# Patient Record
Sex: Female | Born: 1950 | Race: White | Hispanic: No | Marital: Married | Smoking: Never smoker
Health system: Southern US, Community
[De-identification: ages and names within clinical notes are randomized; demographics above are authoritative.]

## PROBLEM LIST (undated history)

## (undated) DIAGNOSIS — G8929 Other chronic pain: Secondary | ICD-10-CM

## (undated) DIAGNOSIS — M549 Dorsalgia, unspecified: Secondary | ICD-10-CM

## (undated) DIAGNOSIS — E876 Hypokalemia: Secondary | ICD-10-CM

## (undated) DIAGNOSIS — R790 Abnormal level of blood mineral: Secondary | ICD-10-CM

## (undated) DIAGNOSIS — R000 Tachycardia, unspecified: Secondary | ICD-10-CM

## (undated) HISTORY — PX: ROTATOR CUFF REPAIR: SHX139

## (undated) HISTORY — PX: CHOLECYSTECTOMY: SHX55

---

## 1997-10-01 ENCOUNTER — Ambulatory Visit (HOSPITAL_COMMUNITY): Admission: RE | Admit: 1997-10-01 | Discharge: 1997-10-01 | Payer: Self-pay | Admitting: Obstetrics & Gynecology

## 1998-03-04 ENCOUNTER — Other Ambulatory Visit: Admission: RE | Admit: 1998-03-04 | Discharge: 1998-03-04 | Payer: Self-pay | Admitting: Obstetrics & Gynecology

## 1998-08-30 ENCOUNTER — Other Ambulatory Visit: Admission: RE | Admit: 1998-08-30 | Discharge: 1998-08-30 | Payer: Self-pay | Admitting: Obstetrics & Gynecology

## 1999-06-14 ENCOUNTER — Other Ambulatory Visit: Admission: RE | Admit: 1999-06-14 | Discharge: 1999-06-14 | Payer: Self-pay | Admitting: Obstetrics & Gynecology

## 2000-07-26 ENCOUNTER — Encounter: Admission: RE | Admit: 2000-07-26 | Discharge: 2000-07-26 | Payer: Self-pay | Admitting: Obstetrics & Gynecology

## 2000-07-26 ENCOUNTER — Encounter: Payer: Self-pay | Admitting: Obstetrics & Gynecology

## 2000-07-26 ENCOUNTER — Other Ambulatory Visit: Admission: RE | Admit: 2000-07-26 | Discharge: 2000-07-26 | Payer: Self-pay | Admitting: Obstetrics & Gynecology

## 2002-07-03 ENCOUNTER — Ambulatory Visit (HOSPITAL_COMMUNITY): Admission: RE | Admit: 2002-07-03 | Discharge: 2002-07-03 | Payer: Self-pay | Admitting: Orthopedic Surgery

## 2002-07-16 ENCOUNTER — Ambulatory Visit (HOSPITAL_BASED_OUTPATIENT_CLINIC_OR_DEPARTMENT_OTHER): Admission: RE | Admit: 2002-07-16 | Discharge: 2002-07-16 | Payer: Self-pay | Admitting: Orthopedic Surgery

## 2003-01-30 ENCOUNTER — Ambulatory Visit (HOSPITAL_BASED_OUTPATIENT_CLINIC_OR_DEPARTMENT_OTHER): Admission: RE | Admit: 2003-01-30 | Discharge: 2003-01-30 | Payer: Self-pay | Admitting: Orthopedic Surgery

## 2004-07-08 ENCOUNTER — Other Ambulatory Visit: Admission: RE | Admit: 2004-07-08 | Discharge: 2004-07-08 | Payer: Self-pay | Admitting: Obstetrics & Gynecology

## 2004-10-13 ENCOUNTER — Other Ambulatory Visit: Admission: RE | Admit: 2004-10-13 | Discharge: 2004-10-13 | Payer: Self-pay | Admitting: Obstetrics & Gynecology

## 2004-12-09 ENCOUNTER — Other Ambulatory Visit: Admission: RE | Admit: 2004-12-09 | Discharge: 2004-12-09 | Payer: Self-pay | Admitting: Obstetrics and Gynecology

## 2006-01-31 ENCOUNTER — Emergency Department: Payer: Self-pay | Admitting: Emergency Medicine

## 2008-07-27 ENCOUNTER — Emergency Department (HOSPITAL_BASED_OUTPATIENT_CLINIC_OR_DEPARTMENT_OTHER): Admission: EM | Admit: 2008-07-27 | Discharge: 2008-07-27 | Payer: Self-pay | Admitting: Emergency Medicine

## 2008-07-27 ENCOUNTER — Ambulatory Visit: Payer: Self-pay | Admitting: Diagnostic Radiology

## 2009-03-08 ENCOUNTER — Ambulatory Visit: Payer: Self-pay | Admitting: Diagnostic Radiology

## 2009-03-08 ENCOUNTER — Encounter (HOSPITAL_COMMUNITY): Payer: Self-pay | Admitting: Emergency Medicine

## 2009-03-08 ENCOUNTER — Inpatient Hospital Stay (HOSPITAL_COMMUNITY): Admission: RE | Admit: 2009-03-08 | Discharge: 2009-03-10 | Payer: Self-pay | Admitting: Internal Medicine

## 2009-03-10 ENCOUNTER — Emergency Department (HOSPITAL_COMMUNITY): Admission: EM | Admit: 2009-03-10 | Discharge: 2009-03-11 | Payer: Self-pay | Admitting: Dermatopathology

## 2010-03-09 ENCOUNTER — Emergency Department (HOSPITAL_COMMUNITY): Admission: EM | Admit: 2010-03-09 | Discharge: 2010-03-09 | Payer: Self-pay | Admitting: Emergency Medicine

## 2010-03-10 ENCOUNTER — Inpatient Hospital Stay (HOSPITAL_COMMUNITY): Admission: EM | Admit: 2010-03-10 | Discharge: 2010-03-14 | Payer: Self-pay | Admitting: Internal Medicine

## 2010-03-12 ENCOUNTER — Encounter (INDEPENDENT_AMBULATORY_CARE_PROVIDER_SITE_OTHER): Payer: Self-pay | Admitting: Internal Medicine

## 2010-05-06 ENCOUNTER — Emergency Department (HOSPITAL_BASED_OUTPATIENT_CLINIC_OR_DEPARTMENT_OTHER)
Admission: EM | Admit: 2010-05-06 | Discharge: 2010-05-06 | Payer: Self-pay | Source: Home / Self Care | Admitting: Emergency Medicine

## 2010-08-09 LAB — CBC
HCT: 43.7 % (ref 36.0–46.0)
Hemoglobin: 15.2 g/dL — ABNORMAL HIGH (ref 12.0–15.0)
MCH: 30.8 pg (ref 26.0–34.0)
MCV: 88.5 fL (ref 78.0–100.0)
Platelets: 441 10*3/uL — ABNORMAL HIGH (ref 150–400)
RBC: 4.94 MIL/uL (ref 3.87–5.11)

## 2010-08-09 LAB — COMPREHENSIVE METABOLIC PANEL
Alkaline Phosphatase: 123 U/L — ABNORMAL HIGH (ref 39–117)
BUN: 12 mg/dL (ref 6–23)
CO2: 18 mEq/L — ABNORMAL LOW (ref 19–32)
Chloride: 101 mEq/L (ref 96–112)
Creatinine, Ser: 0.6 mg/dL (ref 0.4–1.2)
GFR calc non Af Amer: >60 mL/min (ref >60)
Glucose, Bld: 142 mg/dL — ABNORMAL HIGH (ref 70–99)
Potassium: 3.9 mEq/L (ref 3.5–5.1)
Total Bilirubin: 0.9 mg/dL (ref 0.3–1.2)

## 2010-08-09 LAB — DIFFERENTIAL
Basophils Absolute: 0 10*3/uL (ref 0.0–0.1)
Basophils Relative: 0 % (ref 0–1)
Lymphocytes Relative: 13 % (ref 12–46)
Neutro Abs: 13.6 10*3/uL — ABNORMAL HIGH (ref 1.7–7.7)
Neutrophils Relative %: 77 % (ref 43–77)

## 2010-08-09 LAB — URINALYSIS, ROUTINE W REFLEX MICROSCOPIC
Hgb urine dipstick: NEGATIVE
Nitrite: NEGATIVE
Protein, ur: 100 mg/dL — AB
Specific Gravity, Urine: 1.016 (ref 1.005–1.030)
Urobilinogen, UA: 0.2 mg/dL (ref 0.0–1.0)

## 2010-08-09 LAB — LIPASE, BLOOD: Lipase: 32 U/L (ref 23–300)

## 2010-08-09 LAB — URINE MICROSCOPIC-ADD ON

## 2010-08-10 LAB — CARDIAC PANEL(CRET KIN+CKTOT+MB+TROPI)
CK, MB: 1.6 ng/mL (ref 0.3–4.0)
Relative Index: 0.5 (ref 0.0–2.5)
Relative Index: 0.6 (ref 0.0–2.5)
Troponin I: 0.02 ng/mL (ref 0.00–0.06)
Troponin I: 0.02 ng/mL (ref 0.00–0.06)

## 2010-08-10 LAB — BASIC METABOLIC PANEL
BUN: 6 mg/dL (ref 6–23)
BUN: 6 mg/dL (ref 6–23)
BUN: 8 mg/dL (ref 6–23)
CO2: 18 mEq/L — ABNORMAL LOW (ref 19–32)
CO2: 23 mEq/L (ref 19–32)
Calcium: 8.7 mg/dL (ref 8.4–10.5)
Calcium: 8.7 mg/dL (ref 8.4–10.5)
Calcium: 8.9 mg/dL (ref 8.4–10.5)
Creatinine, Ser: 0.47 mg/dL (ref 0.4–1.2)
Creatinine, Ser: 0.58 mg/dL (ref 0.4–1.2)
GFR calc Af Amer: >60 mL/min (ref >60)
GFR calc non Af Amer: >60 mL/min (ref >60)
GFR calc non Af Amer: >60 mL/min (ref >60)
GFR calc non Af Amer: >60 mL/min (ref >60)
Glucose, Bld: 112 mg/dL — ABNORMAL HIGH (ref 70–99)
Glucose, Bld: 114 mg/dL — ABNORMAL HIGH (ref 70–99)
Glucose, Bld: 117 mg/dL — ABNORMAL HIGH (ref 70–99)
Glucose, Bld: 96 mg/dL (ref 70–99)
Potassium: 2.8 mEq/L — ABNORMAL LOW (ref 3.5–5.1)
Potassium: 3.5 mEq/L (ref 3.5–5.1)
Sodium: 129 mEq/L — ABNORMAL LOW (ref 135–145)
Sodium: 131 mEq/L — ABNORMAL LOW (ref 135–145)
Sodium: 131 mEq/L — ABNORMAL LOW (ref 135–145)

## 2010-08-10 LAB — COMPREHENSIVE METABOLIC PANEL
ALT: 145 U/L — ABNORMAL HIGH (ref 0–35)
AST: 89 U/L — ABNORMAL HIGH (ref 0–37)
Albumin: 3.5 g/dL (ref 3.5–5.2)
Albumin: 3.9 g/dL (ref 3.5–5.2)
Alkaline Phosphatase: 80 U/L (ref 39–117)
Alkaline Phosphatase: 96 U/L (ref 39–117)
BUN: 7 mg/dL (ref 6–23)
Chloride: 100 mEq/L (ref 96–112)
Chloride: 104 mEq/L (ref 96–112)
Creatinine, Ser: 0.61 mg/dL (ref 0.4–1.2)
GFR calc Af Amer: >60 mL/min (ref >60)
Glucose, Bld: 81 mg/dL (ref 70–99)
Potassium: 2.7 mEq/L — CL (ref 3.5–5.1)
Potassium: 2.7 mEq/L — CL (ref 3.5–5.1)
Sodium: 129 mEq/L — ABNORMAL LOW (ref 135–145)
Total Bilirubin: 1.1 mg/dL (ref 0.3–1.2)
Total Bilirubin: 1.1 mg/dL (ref 0.3–1.2)
Total Protein: 6.9 g/dL (ref 6.0–8.3)

## 2010-08-11 LAB — HEPATIC FUNCTION PANEL
ALT: 25 U/L (ref 0–35)
AST: 26 U/L (ref 0–37)
Bilirubin, Direct: 0.1 mg/dL (ref 0.0–0.3)
Total Bilirubin: 0.6 mg/dL (ref 0.3–1.2)

## 2010-08-11 LAB — URINALYSIS, ROUTINE W REFLEX MICROSCOPIC
Glucose, UA: NEGATIVE mg/dL
Ketones, ur: NEGATIVE mg/dL
Protein, ur: NEGATIVE mg/dL

## 2010-08-11 LAB — CBC
HCT: 42.2 % (ref 36.0–46.0)
HCT: 34 % — ABNORMAL LOW (ref 36.0–46.0)
HCT: 37.8 % (ref 36.0–46.0)
Hemoglobin: 11.4 g/dL — ABNORMAL LOW (ref 12.0–15.0)
MCH: 32.3 pg (ref 26.0–34.0)
MCHC: 34.2 g/dL (ref 30.0–36.0)
MCHC: 33.5 g/dL (ref 30.0–36.0)
MCHC: 34.9 g/dL (ref 30.0–36.0)
MCV: 94.5 fL (ref 78.0–100.0)
MCV: 93.9 fL (ref 78.0–100.0)
RDW: 13.4 % (ref 11.5–15.5)
RDW: 13.2 % (ref 11.5–15.5)

## 2010-08-11 LAB — BASIC METABOLIC PANEL
BUN: 9 mg/dL (ref 6–23)
Calcium: 8.9 mg/dL (ref 8.4–10.5)
Chloride: 104 mEq/L (ref 96–112)
Creatinine, Ser: 0.55 mg/dL (ref 0.4–1.2)
GFR calc Af Amer: >60 mL/min (ref >60)
GFR calc non Af Amer: >60 mL/min (ref >60)
Glucose, Bld: 127 mg/dL — ABNORMAL HIGH (ref 70–99)
Potassium: 3.5 mEq/L (ref 3.5–5.1)
Sodium: 136 mEq/L (ref 135–145)

## 2010-08-11 LAB — DIFFERENTIAL
Basophils Absolute: 0 10*3/uL (ref 0.0–0.1)
Basophils Relative: 0 % (ref 0–1)
Eosinophils Absolute: 0 10*3/uL (ref 0.0–0.7)
Eosinophils Relative: 0 % (ref 0–5)
Monocytes Absolute: 0.5 10*3/uL (ref 0.1–1.0)

## 2010-08-11 LAB — COMPREHENSIVE METABOLIC PANEL
ALT: 84 U/L — ABNORMAL HIGH (ref 0–35)
AST: 73 U/L — ABNORMAL HIGH (ref 0–37)
Albumin: 3.2 g/dL — ABNORMAL LOW (ref 3.5–5.2)
Calcium: 8.5 mg/dL (ref 8.4–10.5)
Creatinine, Ser: 0.68 mg/dL (ref 0.4–1.2)
GFR calc Af Amer: >60 mL/min (ref >60)
GFR calc non Af Amer: >60 mL/min (ref >60)
Sodium: 140 mEq/L (ref 135–145)
Total Protein: 5.7 g/dL — ABNORMAL LOW (ref 6.0–8.3)

## 2010-08-11 LAB — AMYLASE: Amylase: 52 U/L (ref 0–105)

## 2010-08-11 LAB — CK TOTAL AND CKMB (NOT AT ARMC): CK, MB: 2.9 ng/mL (ref 0.3–4.0)

## 2010-08-11 LAB — MAGNESIUM: Magnesium: 1.9 mg/dL (ref 1.5–2.5)

## 2010-09-01 LAB — COMPREHENSIVE METABOLIC PANEL
ALT: 29 U/L (ref 0–35)
AST: 31 U/L (ref 0–37)
Albumin: 3.6 g/dL (ref 3.5–5.2)
Alkaline Phosphatase: 74 U/L (ref 39–117)
CO2: 25 mEq/L (ref 19–32)
Calcium: 10.7 mg/dL — ABNORMAL HIGH (ref 8.4–10.5)
Creatinine, Ser: 0.6 mg/dL (ref 0.4–1.2)
GFR calc Af Amer: >60 mL/min (ref >60)
GFR calc Af Amer: >60 mL/min (ref >60)
GFR calc non Af Amer: >60 mL/min (ref >60)
Glucose, Bld: 127 mg/dL — ABNORMAL HIGH (ref 70–99)
Potassium: 3.6 mEq/L (ref 3.5–5.1)
Sodium: 136 mEq/L (ref 135–145)
Total Protein: 8.6 g/dL — ABNORMAL HIGH (ref 6.0–8.3)
Total Protein: 7 g/dL (ref 6.0–8.3)

## 2010-09-01 LAB — BASIC METABOLIC PANEL
BUN: 8 mg/dL (ref 6–23)
Chloride: 105 mEq/L (ref 96–112)
GFR calc Af Amer: >60 mL/min (ref >60)
Potassium: 3.2 mEq/L — ABNORMAL LOW (ref 3.5–5.1)
Sodium: 134 mEq/L — ABNORMAL LOW (ref 135–145)

## 2010-09-01 LAB — CBC
HCT: 33 % — ABNORMAL LOW (ref 36.0–46.0)
Hemoglobin: 11.1 g/dL — ABNORMAL LOW (ref 12.0–15.0)
MCHC: 33.8 g/dL (ref 30.0–36.0)
MCV: 95.1 fL (ref 78.0–100.0)
MCV: 95.9 fL (ref 78.0–100.0)
Platelets: 405 10*3/uL — ABNORMAL HIGH (ref 150–400)
RBC: 4.35 MIL/uL (ref 3.87–5.11)
RBC: 3.44 MIL/uL — ABNORMAL LOW (ref 3.87–5.11)
RDW: 12.3 % (ref 11.5–15.5)
RDW: 12.9 % (ref 11.5–15.5)
WBC: 8.2 10*3/uL (ref 4.0–10.5)

## 2010-09-01 LAB — DIFFERENTIAL
Basophils Relative: 0 % (ref 0–1)
Eosinophils Absolute: 0 10*3/uL (ref 0.0–0.7)
Eosinophils Absolute: 0.1 10*3/uL (ref 0.0–0.7)
Eosinophils Relative: 1 % (ref 0–5)
Lymphocytes Relative: 16 % (ref 12–46)
Lymphs Abs: 2.2 10*3/uL (ref 0.7–4.0)
Lymphs Abs: 3.2 10*3/uL (ref 0.7–4.0)
Monocytes Absolute: 0.7 10*3/uL (ref 0.1–1.0)
Monocytes Absolute: 0.8 10*3/uL (ref 0.1–1.0)
Monocytes Relative: 10 % (ref 3–12)
Monocytes Relative: 5 % (ref 3–12)
Neutro Abs: 11.5 10*3/uL — ABNORMAL HIGH (ref 1.7–7.7)
Neutrophils Relative %: 79 % — ABNORMAL HIGH (ref 43–77)

## 2010-09-01 LAB — POCT I-STAT, CHEM 8
Creatinine, Ser: 0.4 mg/dL (ref 0.4–1.2)
Glucose, Bld: 83 mg/dL (ref 70–99)
Hemoglobin: 13.3 g/dL (ref 12.0–15.0)
TCO2: 21 mmol/L (ref 0–100)

## 2010-09-01 LAB — CARDIAC PANEL(CRET KIN+CKTOT+MB+TROPI)
CK, MB: 2.7 ng/mL (ref 0.3–4.0)
Relative Index: 1.6 (ref 0.0–2.5)
Relative Index: 1.8 (ref 0.0–2.5)
Relative Index: 2 (ref 0.0–2.5)
Troponin I: 0.01 ng/mL (ref 0.00–0.06)
Troponin I: 0.01 ng/mL (ref 0.00–0.06)

## 2010-09-01 LAB — LIPASE, BLOOD: Lipase: 70 U/L (ref 23–300)

## 2010-09-01 LAB — LIPID PANEL
Triglycerides: 93 mg/dL (ref <150)
VLDL: 19 mg/dL (ref 0–40)

## 2010-09-08 LAB — URINALYSIS, ROUTINE W REFLEX MICROSCOPIC
Glucose, UA: NEGATIVE mg/dL
Ketones, ur: NEGATIVE mg/dL
Protein, ur: NEGATIVE mg/dL

## 2010-09-08 LAB — COMPREHENSIVE METABOLIC PANEL
ALT: 34 U/L (ref 0–35)
Alkaline Phosphatase: 78 U/L (ref 39–117)
CO2: 26 mEq/L (ref 19–32)
Chloride: 96 mEq/L (ref 96–112)
GFR calc non Af Amer: >60 mL/min (ref >60)
Glucose, Bld: 99 mg/dL (ref 70–99)
Potassium: 5.3 mEq/L — ABNORMAL HIGH (ref 3.5–5.1)
Sodium: 130 mEq/L — ABNORMAL LOW (ref 135–145)
Total Bilirubin: 0.6 mg/dL (ref 0.3–1.2)
Total Protein: 7.4 g/dL (ref 6.0–8.3)

## 2010-09-08 LAB — DIFFERENTIAL
Basophils Relative: 1 % (ref 0–1)
Eosinophils Absolute: 0.3 10*3/uL (ref 0.0–0.7)
Monocytes Relative: 9 % (ref 3–12)
Neutrophils Relative %: 63 % (ref 43–77)

## 2010-09-08 LAB — CBC
MCHC: 34.4 g/dL (ref 30.0–36.0)
MCV: 95.6 fL (ref 78.0–100.0)
RDW: 12.7 % (ref 11.5–15.5)

## 2010-09-08 LAB — URINE CULTURE: Culture: NO GROWTH

## 2010-10-14 NOTE — Op Note (Signed)
NAME:  Katie Lloyd, Katie Lloyd                            ACCOUNT NO.:  [id number]   MEDICAL RECORD NO.:  [id number]                   PATIENT TYPE:  OUT   LOCATION:  VASC                                 FACILITY:  [facility]   PHYSICIAN:  Thera Flake., M.D.             DATE OF BIRTH:  [birth year]   DATE OF PROCEDURE:  07/16/2002  DATE OF DISCHARGE:  07/03/2002                                 OPERATIVE REPORT   PREOPERATIVE DIAGNOSES:  1. Torn medial and lateral menisci, right knee.  2. Grade 3 chondromalacia, medial compartment; grade 2 and 3 chondromalacia,     lateral compartment, grade 3 and 4 chondromalacia, patellofemoral joint     with lateral tracking of the patella.   POSTOPERATIVE DIAGNOSES:  1. Torn medial and lateral menisci, right knee.  2. Grade 3 chondromalacia, medial compartment; grade 2 and 3 chondromalacia,     lateral compartment, grade 3 and 4 chondromalacia, patellofemoral joint     with lateral tracking of the patella.   OPERATION PERFORMED:  1. Partial medial and lateral meniscectomy.  2. Debridement chondroplasty.  3. Arthroscopic lateral release.   SURGEON:  Dyke Brackett, M.D.   ANESTHESIA:  MAC.   INDICATIONS FOR PROCEDURE:  The patient is a 60 year old with MRI proven  right knee medial meniscal tearing felt to be amenable to outpatient  arthroscopy.  She also has lateral patellar retracting and degenerative  change in the lateral facet.  She was advised that this may be of benefit to  do a lateral release.   DESCRIPTION OF PROCEDURE:  The patient was arthroscoped through a  superomedial, inferomedial and posterolateral portal.  Systematic inspection  of the knee showed the patient to have severe chondromalacia patella, grade  3 mostly on the lateral side with actually grade 4 changes on the lateral  trochlear groove and lateral femoral condyle.  The medial compartment on the  weightbearing surface of the medial femoral condyle showed advanced grade 3  but no grade 4 changes, less severe grade 2 changes and early grade 3  changes on the tibia.  The posterior horn tear of the medial meniscus  requiring resection of about 30% of the meniscus substance.  ACL, PCL were  normal.  Lateral meniscus showed a small radial tear requiring 5 to 10%  resection of the meniscus with less severe early grade 3 changes along the  femoral condyle and grade 2 changes along the tibial plateau.  Tricompartmental debridement was carried out.  Arthroscopic lateral release  carried out on the lateral side with the ArthroCare wand.  The knee was  drained free of fluid.  Portals of the joint were infiltrated with Marcaine  and morphine with addition of 40 mg Depo-Medrol into the joint.  Lightly  compressive sterile dressing was applied.  Thera Flake., M.D.   WDC/MEDQ  D:  07/16/2002  T:  07/16/2002  Job:  [id number]

## 2010-10-14 NOTE — Op Note (Signed)
   NAME:  Katie Lloyd, Katie Lloyd                            ACCOUNT NO.:  [id number]   MEDICAL RECORD NO.:  [id number]                   PATIENT TYPE:  AMB   LOCATION:  DSC                                  FACILITY:  [facility]   PHYSICIAN:  Thera Flake., M.D.             DATE OF BIRTH:  02/19/1951   DATE OF PROCEDURE:  DATE OF DISCHARGE:                                 OPERATIVE REPORT   PREOPERATIVE DIAGNOSIS:  Grade 3 chondromalacia of the medial, lateral, and  patellofemoral joints (advanced).   POSTOPERATIVE DIAGNOSIS:  Grade 3 chondromalacia of the medial, lateral, and  patellofemoral joints (advanced).   PROCEDURE:  1. Debridement chondroplasty (3 tricompartmental).  2. Excision thick fibrotic shelf anterior medial knee.   SURGEON:  Dyke Brackett, M.D.   ANESTHESIA:  MAC   DESCRIPTION OF PROCEDURE:  Arthroscope through an inferomedial and  inferolateral portal.  Systematic inspection of the knee showed the patient  to have advanced grade 3 changes of the patellofemoral joint; no grade 4  changes, likewise, particularly on the femoral side.  On the  medial side  there were advanced grade 3 changes; less severe, but still grade 3 changes  on the tibia.  These were redebrided.  These looked reasonably good. There  was a punctate lesion of grade 3 changes probably over a 2 x 2 cm in the  lateral condyle.  This seemed to be more advanced than the last procedure.  Certainly it was of some concern given the patient's pain that a lot of the  pain in general would be construed to be due to arthritis.  Aggressive  debridement of all 3 compartments was carried out.   In terms of preoperative exam showing a very thick fibrotic plica, and very  painful, this was confirmed.  It was repetitively examined in the office.  Aggressive debridement of the plica was carried out relieving the scar-type  tissue along the anterior medial condyle.  The knee was drained, free of  fluid.  Portals were  closed and knee infiltrated with Marcaine and morphine  in addition to 4 mg of Depo-Medrol.  Taken to the recovery room in stable  condition.                                               Thera Flake., M.D.    WDC/MEDQ  D:  01/30/2003  T:  01/30/2003  Job:  [id number]

## 2011-03-17 ENCOUNTER — Emergency Department (HOSPITAL_BASED_OUTPATIENT_CLINIC_OR_DEPARTMENT_OTHER)
Admission: EM | Admit: 2011-03-17 | Discharge: 2011-03-17 | Disposition: A | Discharge status: Other Acute Inpatient Hospital | Payer: PRIVATE HEALTH INSURANCE | Attending: Emergency Medicine | Admitting: Emergency Medicine

## 2011-03-17 ENCOUNTER — Other Ambulatory Visit: Payer: Self-pay

## 2011-03-17 ENCOUNTER — Emergency Department (INDEPENDENT_AMBULATORY_CARE_PROVIDER_SITE_OTHER): Payer: PRIVATE HEALTH INSURANCE

## 2011-03-17 ENCOUNTER — Encounter: Payer: Self-pay | Admitting: *Deleted

## 2011-03-17 DIAGNOSIS — J45909 Unspecified asthma, uncomplicated: Secondary | ICD-10-CM | POA: Insufficient documentation

## 2011-03-17 DIAGNOSIS — R1115 Cyclical vomiting syndrome unrelated to migraine: Secondary | ICD-10-CM | POA: Insufficient documentation

## 2011-03-17 DIAGNOSIS — G8929 Other chronic pain: Secondary | ICD-10-CM | POA: Insufficient documentation

## 2011-03-17 DIAGNOSIS — R112 Nausea with vomiting, unspecified: Secondary | ICD-10-CM

## 2011-03-17 DIAGNOSIS — R0789 Other chest pain: Secondary | ICD-10-CM

## 2011-03-17 DIAGNOSIS — R1110 Vomiting, unspecified: Secondary | ICD-10-CM

## 2011-03-17 DIAGNOSIS — R109 Unspecified abdominal pain: Secondary | ICD-10-CM

## 2011-03-17 HISTORY — DX: Dorsalgia, unspecified: M54.9

## 2011-03-17 HISTORY — DX: Other chronic pain: G89.29

## 2011-03-17 HISTORY — DX: Tachycardia, unspecified: R00.0

## 2011-03-17 HISTORY — DX: Hypokalemia: E87.6

## 2011-03-17 HISTORY — DX: Abnormal level of blood mineral: R79.0

## 2011-03-17 LAB — CBC
HCT: 39.1 % (ref 36.0–46.0)
Hemoglobin: 13.9 g/dL (ref 12.0–15.0)
MCH: 31.4 pg (ref 26.0–34.0)
MCHC: 35.5 g/dL (ref 30.0–36.0)
MCV: 88.3 fL (ref 78.0–100.0)
Platelets: 396 K/uL (ref 150–400)
RBC: 4.43 MIL/uL (ref 3.87–5.11)
RDW: 12.3 % (ref 11.5–15.5)
WBC: 15.4 K/uL — ABNORMAL HIGH (ref 4.0–10.5)

## 2011-03-17 LAB — URINALYSIS, ROUTINE W REFLEX MICROSCOPIC
Bilirubin Urine: NEGATIVE
Glucose, UA: NEGATIVE mg/dL
Hgb urine dipstick: NEGATIVE
Ketones, ur: NEGATIVE mg/dL
Leukocytes, UA: NEGATIVE
Nitrite: NEGATIVE
Protein, ur: NEGATIVE mg/dL
Specific Gravity, Urine: 1.019 (ref 1.005–1.030)
Urobilinogen, UA: 0.2 mg/dL (ref 0.0–1.0)
pH: 7 (ref 5.0–8.0)

## 2011-03-17 LAB — COMPREHENSIVE METABOLIC PANEL WITH GFR
ALT: 24 U/L (ref 0–35)
AST: 18 U/L (ref 0–37)
Albumin: 4.3 g/dL (ref 3.5–5.2)
Alkaline Phosphatase: 112 U/L (ref 39–117)
BUN: 9 mg/dL (ref 6–23)
CO2: 23 meq/L (ref 19–32)
Calcium: 9.7 mg/dL (ref 8.4–10.5)
Chloride: 93 meq/L — ABNORMAL LOW (ref 96–112)
Creatinine, Ser: 0.5 mg/dL (ref 0.50–1.10)
GFR calc Af Amer: >90 mL/min
GFR calc non Af Amer: >90 mL/min
Glucose, Bld: 140 mg/dL — ABNORMAL HIGH (ref 70–99)
Potassium: 3.5 meq/L (ref 3.5–5.1)
Sodium: 130 meq/L — ABNORMAL LOW (ref 135–145)
Total Bilirubin: 0.4 mg/dL (ref 0.3–1.2)
Total Protein: 8.1 g/dL (ref 6.0–8.3)

## 2011-03-17 LAB — DIFFERENTIAL
Lymphocytes Relative: 14 % (ref 12–46)
Lymphs Abs: 2.1 10*3/uL (ref 0.7–4.0)
Monocytes Relative: 5 % (ref 3–12)
Neutro Abs: 12.6 10*3/uL — ABNORMAL HIGH (ref 1.7–7.7)
Neutrophils Relative %: 82 % — ABNORMAL HIGH (ref 43–77)

## 2011-03-17 LAB — MAGNESIUM: Magnesium: 1.6 mg/dL (ref 1.5–2.5)

## 2011-03-17 LAB — LIPASE, BLOOD: Lipase: 16 U/L (ref 11–59)

## 2011-03-17 MED ORDER — FENTANYL CITRATE 0.05 MG/ML IJ SOLN
50.0000 ug | Freq: Once | INTRAMUSCULAR | Status: AC
  Administered 2011-03-17: 100 ug via INTRAVENOUS
  Filled 2011-03-17: qty 2

## 2011-03-17 MED ORDER — LORAZEPAM 2 MG/ML PO CONC
1.0000 mg | Freq: Once | ORAL | Status: AC
  Administered 2011-03-17: 1 mg via ORAL
  Filled 2011-03-17: qty 0.5

## 2011-03-17 MED ORDER — LORAZEPAM 2 MG/ML IJ SOLN
INTRAMUSCULAR | Status: AC
  Filled 2011-03-17: qty 1

## 2011-03-17 MED ORDER — FENTANYL CITRATE 0.05 MG/ML IJ SOLN
100.0000 ug | Freq: Once | INTRAMUSCULAR | Status: AC
  Administered 2011-03-17: 100 ug via INTRAVENOUS
  Filled 2011-03-17: qty 2

## 2011-03-17 MED ORDER — PANTOPRAZOLE SODIUM 40 MG IV SOLR
40.0000 mg | Freq: Once | INTRAVENOUS | Status: AC
  Administered 2011-03-17: 40 mg via INTRAVENOUS
  Filled 2011-03-17: qty 40

## 2011-03-17 MED ORDER — ONDANSETRON HCL 4 MG/2ML IJ SOLN
4.0000 mg | Freq: Once | INTRAMUSCULAR | Status: AC
  Administered 2011-03-17: 4 mg via INTRAVENOUS
  Filled 2011-03-17: qty 2

## 2011-03-17 MED ORDER — METOCLOPRAMIDE HCL 5 MG/ML IJ SOLN
10.0000 mg | Freq: Once | INTRAMUSCULAR | Status: AC
  Administered 2011-03-17: 10 mg via INTRAVENOUS
  Filled 2011-03-17: qty 2

## 2011-03-17 MED ORDER — DIPHENHYDRAMINE HCL 50 MG/ML IJ SOLN
25.0000 mg | Freq: Once | INTRAMUSCULAR | Status: AC
  Administered 2011-03-17: 50 mg via INTRAVENOUS
  Filled 2011-03-17: qty 1

## 2011-03-17 MED ORDER — SODIUM CHLORIDE 0.9 % IV BOLUS (SEPSIS)
2000.0000 mL | Freq: Once | INTRAVENOUS | Status: AC
  Administered 2011-03-17: 1000 mL via INTRAVENOUS

## 2011-03-17 MED ORDER — FENTANYL CITRATE 0.05 MG/ML IJ SOLN
75.0000 ug | Freq: Once | INTRAMUSCULAR | Status: AC
  Administered 2011-03-17: 75 ug via INTRAVENOUS
  Filled 2011-03-17: qty 2

## 2011-03-17 NOTE — ED Notes (Signed)
Report given to Teri RN

## 2011-03-17 NOTE — ED Notes (Signed)
Pt reports sudden onset of vomiting around 6pm last night, followed by abdominal pains and diarrhea. Denies fevers. Denies urinary symptoms.

## 2011-03-17 NOTE — ED Notes (Signed)
Called dr. Enis Slipper office for a pager to call [phone]. Given a number to call the hospital in [place] at [phone] for page for dr. Ignacia Palma to speak with dr. Enis Slipper.

## 2011-03-17 NOTE — ED Notes (Signed)
Per EMS: pt c/o abdominal pains with N/V/D since 8pm last night. Unable to hold down food/fluids. Pt reports "this is an annual occurrence". Pt is pale upon arrival to ER. Pt is dry heaving. IV started by EMS with bolus. Pt took 75mg  of phenergan over the last 6 hours without relief.

## 2011-03-17 NOTE — ED Notes (Signed)
[facility] is to transfer [facility] to [facility] to room 308.  Receiving doctor is brent seifert. The receiving nurse will be wanda bradshaw.

## 2011-03-17 NOTE — ED Notes (Signed)
[facility]ia [facility]--spoke with Rhonda--gave her information on patient.  Will call back with room and doctor's first name when dr. Enis Slipper or hospital call back with information.

## 2011-03-17 NOTE — ED Provider Notes (Signed)
  Physical Exam  BP 157/84  Pulse 87  Temp(Src) 98.2 F (36.8 C) (Oral)  Resp 18  Ht 5\' 9"  (1.753 m)  Wt 220 lb (99.791 kg)  BMI 32.49 kg/m2  SpO2 96%  Physical Exam  ED Course  Procedures   7:18 AM Care assumed from Dr. Read Drivers.  Pt is a 60 year old woman who has had abdominal pain, vomiting and diarrhea since yesterday.  She has this every October, as confirmed by review of her records. She says she has vomited once since arriving here today. She is receiving IV fluids and IV anti-emetics. On exam now, she has mild upper abdominal tenderness. Lab workup to this point shows elevated WBC of about 15,000, chemistries with Na+ of 130, all other labs relatively normal.  Awaiting acute abdominal series, will continue to observe.  8:23 AM Pt's abdominal series shows moderate stool burden, otherwise negative.  She has vomited again, and continues to have abdominal pain.  Will remedicate.  If she does not improve, will need hospitalization.  9:39 AM She continues with abdominal pain, nausea.  Will call her physician, Dr. Enis Slipper in [place], [place].    9:49 AM Case discussed with Mia Creek, M.D.  He will arrange transfer of pt to [facility].    10:58 AM Pt has been accepted in transfer to [facility] by Mia Creek, M.D.  [facility] will transport her there.  Diagnosis:  Intractable vomiting.  Carleene Cooper III, MD 03/17/11 [phone]

## 2011-03-17 NOTE — ED Provider Notes (Signed)
History     CSN: [id number] Arrival date & time: 03/17/2011  5:59 AM   First MD Initiated Contact with Patient 03/17/11 [phone]      Chief Complaint  Patient presents with  . GI Problem    (Consider location/radiation/quality/duration/timing/severity/associated sxs/prior treatment) HPI This is a 60 year old white female who had the sudden onset of nausea vomiting and diarrhea yesterday afternoon about 3 PM. She is continued to vomit and have diarrhea since. She tried taking rectal Phenergan at home on 3 occasions with some relief of vomiting but no relief of the nausea. She describes the symptoms as severe. She has had upper abdominal pain associated with this. The pain came on after she began vomiting. She feels weak and has a dry mouth. She denies blood in her stool. She states she has a similar illness every October about this time. She was given a 500 mL bolus of normal saline and 4 mg of Zofran IV prior to arrival by EMS. There was little improvement in her nausea and she was noted to be retching on arrival by nursing staff; staff also noted ileus emesis on the patient's clothing. She states she has been hospitalized in the past for this due to the low potassium, low magnesium and the need for Zofran every 2 hours.  Past Medical History  Diagnosis Date  . Asthma   . Hypokalemia   . Low magnesium levels   . Tachycardia   . Hypotension   . Chronic back pain     Past Surgical History  Procedure Date  . Cholecystectomy   . Rotator cuff repair     No family history on file.  History  Substance Use Topics  . Smoking status: Never Smoker   . Smokeless tobacco: Not on file  . Alcohol Use: No    OB History    Grav Para Term Preterm Abortions TAB SAB Ect Mult Living                  Review of Systems  All other systems reviewed and are negative.    Allergies  Review of patient's allergies indicates no known allergies.  Home Medications   Current Outpatient Rx  Name  Route Sig Dispense Refill  . ALBUTEROL SULFATE HFA 108 (90 BASE) MCG/ACT IN AERS Inhalation Inhale 2 puffs into the lungs every 6 (six) hours as needed.      . ATENOLOL 25 MG PO TABS Oral Take 25 mg by mouth 2 (two) times daily.      Marland Kitchen CARISOPRODOL 350 MG PO TABS Oral Take 350 mg by mouth 3 (three) times daily as needed.      Marland Kitchen FLUTICASONE-SALMETEROL 100-50 MCG/DOSE IN AEPB Inhalation Inhale 1 puff into the lungs every 12 (twelve) hours.      . OXYCODONE HCL 40 MG PO TB12 Oral Take 40 mg by mouth 3 (three) times daily.        BP 145/75  Pulse 83  Temp(Src) 98.2 F (36.8 C) (Oral)  Resp 16  SpO2 100%  Physical Exam General: Well-developed, well-nourished female in no acute distress; appearance consistent with age of record HENT: normocephalic, atraumatic; oral mucous membranes dry Eyes: pupils equal round and reactive to light; extraocular muscles intact Neck: supple Heart: regular rate and rhythm Lungs: clear to auscultation bilaterally Abdomen: soft; upper abdominal tenderness notably in the epigastrium, which is moderate; nondistended; no masses palpated; bowel sounds decreased Extremities: No deformity; full range of motion; pulses normal; no Neurologic: Awake, alert  and oriented;motor function intact in all extremities and symmetric; no facial droop Skin: Pale Psychiatric: Anxious; flat affect    ED Course  Procedures (including critical care time)    MDM   EKG Interpretation:  Date & Time: 03/17/2011 6:48 AM  Rate: 89  Rhythm: normal sinus rhythm  QRS Axis: normal  Intervals: normal  ST/T Wave abnormalities: normal  Conduction Disutrbances:none  Narrative Interpretation:   Old EKG Reviewed: none available  Nursing notes and vitals signs, including pulse oximetry, reviewed.  Summary of this visit's results, reviewed by myself:  Labs:  Results for orders placed during the hospital encounter of 03/17/11  CBC      Component Value Range   WBC 15.4 (*) 4.0 -  10.5 (K/uL)   RBC 4.43  3.87 - 5.11 (MIL/uL)   Hemoglobin 13.9  12.0 - 15.0 (g/dL)   HCT 16.1  09.6 - 04.5 (%)   MCV 88.3  78.0 - 100.0 (fL)   MCH 31.4  26.0 - 34.0 (pg)   MCHC 35.5  30.0 - 36.0 (g/dL)   RDW 40.9  81.1 - 91.4 (%)   Platelets 396  150 - 400 (K/uL)  DIFFERENTIAL      Component Value Range   Neutrophils Relative 82 (*) 43 - 77 (%)   Neutro Abs 12.6 (*) 1.7 - 7.7 (K/uL)   Lymphocytes Relative 14  12 - 46 (%)   Lymphs Abs 2.1  0.7 - 4.0 (K/uL)   Monocytes Relative 5  3 - 12 (%)   Monocytes Absolute 0.7  0.1 - 1.0 (K/uL)   Eosinophils Relative 0  0 - 5 (%)   Eosinophils Absolute 0.0  0.0 - 0.7 (K/uL)   Basophils Relative 0  0 - 1 (%)   Basophils Absolute 0.0  0.0 - 0.1 (K/uL)  COMPREHENSIVE METABOLIC PANEL      Component Value Range   Sodium 130 (*) 135 - 145 (mEq/L)   Potassium 3.5  3.5 - 5.1 (mEq/L)   Chloride 93 (*) 96 - 112 (mEq/L)   CO2 23  19 - 32 (mEq/L)   Glucose, Bld 140 (*) 70 - 99 (mg/dL)   BUN 9  6 - 23 (mg/dL)   Creatinine, Ser 7.82  0.50 - 1.10 (mg/dL)   Calcium 9.7  8.4 - 95.6 (mg/dL)   Total Protein 8.1  6.0 - 8.3 (g/dL)   Albumin 4.3  3.5 - 5.2 (g/dL)   AST 18  0 - 37 (U/L)   ALT 24  0 - 35 (U/L)   Alkaline Phosphatase 112  39 - 117 (U/L)   Total Bilirubin 0.4  0.3 - 1.2 (mg/dL)   GFR calc non Af Amer >90  >90 (mL/min)   GFR calc Af Amer >90  >90 (mL/min)  LIPASE, BLOOD      Component Value Range   Lipase 16  11 - 59 (U/L)  URINALYSIS, ROUTINE W REFLEX MICROSCOPIC      Component Value Range   Color, Urine YELLOW  YELLOW    Appearance CLOUDY (*) CLEAR    Specific Gravity, Urine 1.019  1.005 - 1.030    pH 7.0  5.0 - 8.0    Glucose, UA NEGATIVE  NEGATIVE (mg/dL)   Hgb urine dipstick NEGATIVE  NEGATIVE    Bilirubin Urine NEGATIVE  NEGATIVE    Ketones, ur NEGATIVE  NEGATIVE (mg/dL)   Protein, ur NEGATIVE  NEGATIVE (mg/dL)   Urobilinogen, UA 0.2  0.0 - 1.0 (mg/dL)   Nitrite NEGATIVE  NEGATIVE    Leukocytes, UA NEGATIVE  NEGATIVE     MAGNESIUM      Component Value Range   Magnesium 1.6  1.5 - 2.5 (mg/dL)   8:29 AM IV fluid bolus infusing. Labs reviewed and noted to be normal with the exception of elevated white count and low sodium.              Hanley Seamen, MD 03/17/11 [phone]

## 2011-03-17 NOTE — ED Notes (Signed)
Patient's family member spoke with Judie Petit. Lottie Rater and told her that the patient's doctor told the family that the patient was to go home and rest and follow up with him later today if not better.  Requested that we let the EDP know so she could go home.  A short time later, the family member came to the nurses desk and told this nurse that the patient's physician wants her admitted to [facility]  Information received of PCP name and phone # and relayed info to Dr, Ignacia Palma.

## 2011-03-17 NOTE — ED Notes (Signed)
EMS gave Zofran 4mg  IV PTA

## 2012-01-08 IMAGING — CR DG CHEST 2V
2 series · 2 of 2 positions shown · non-contrast
Comparison: 03/10/2009

CLINICAL DATA: Nausea, vomiting, diarrhea

CHEST - 2 VIEW

[w chest lat]
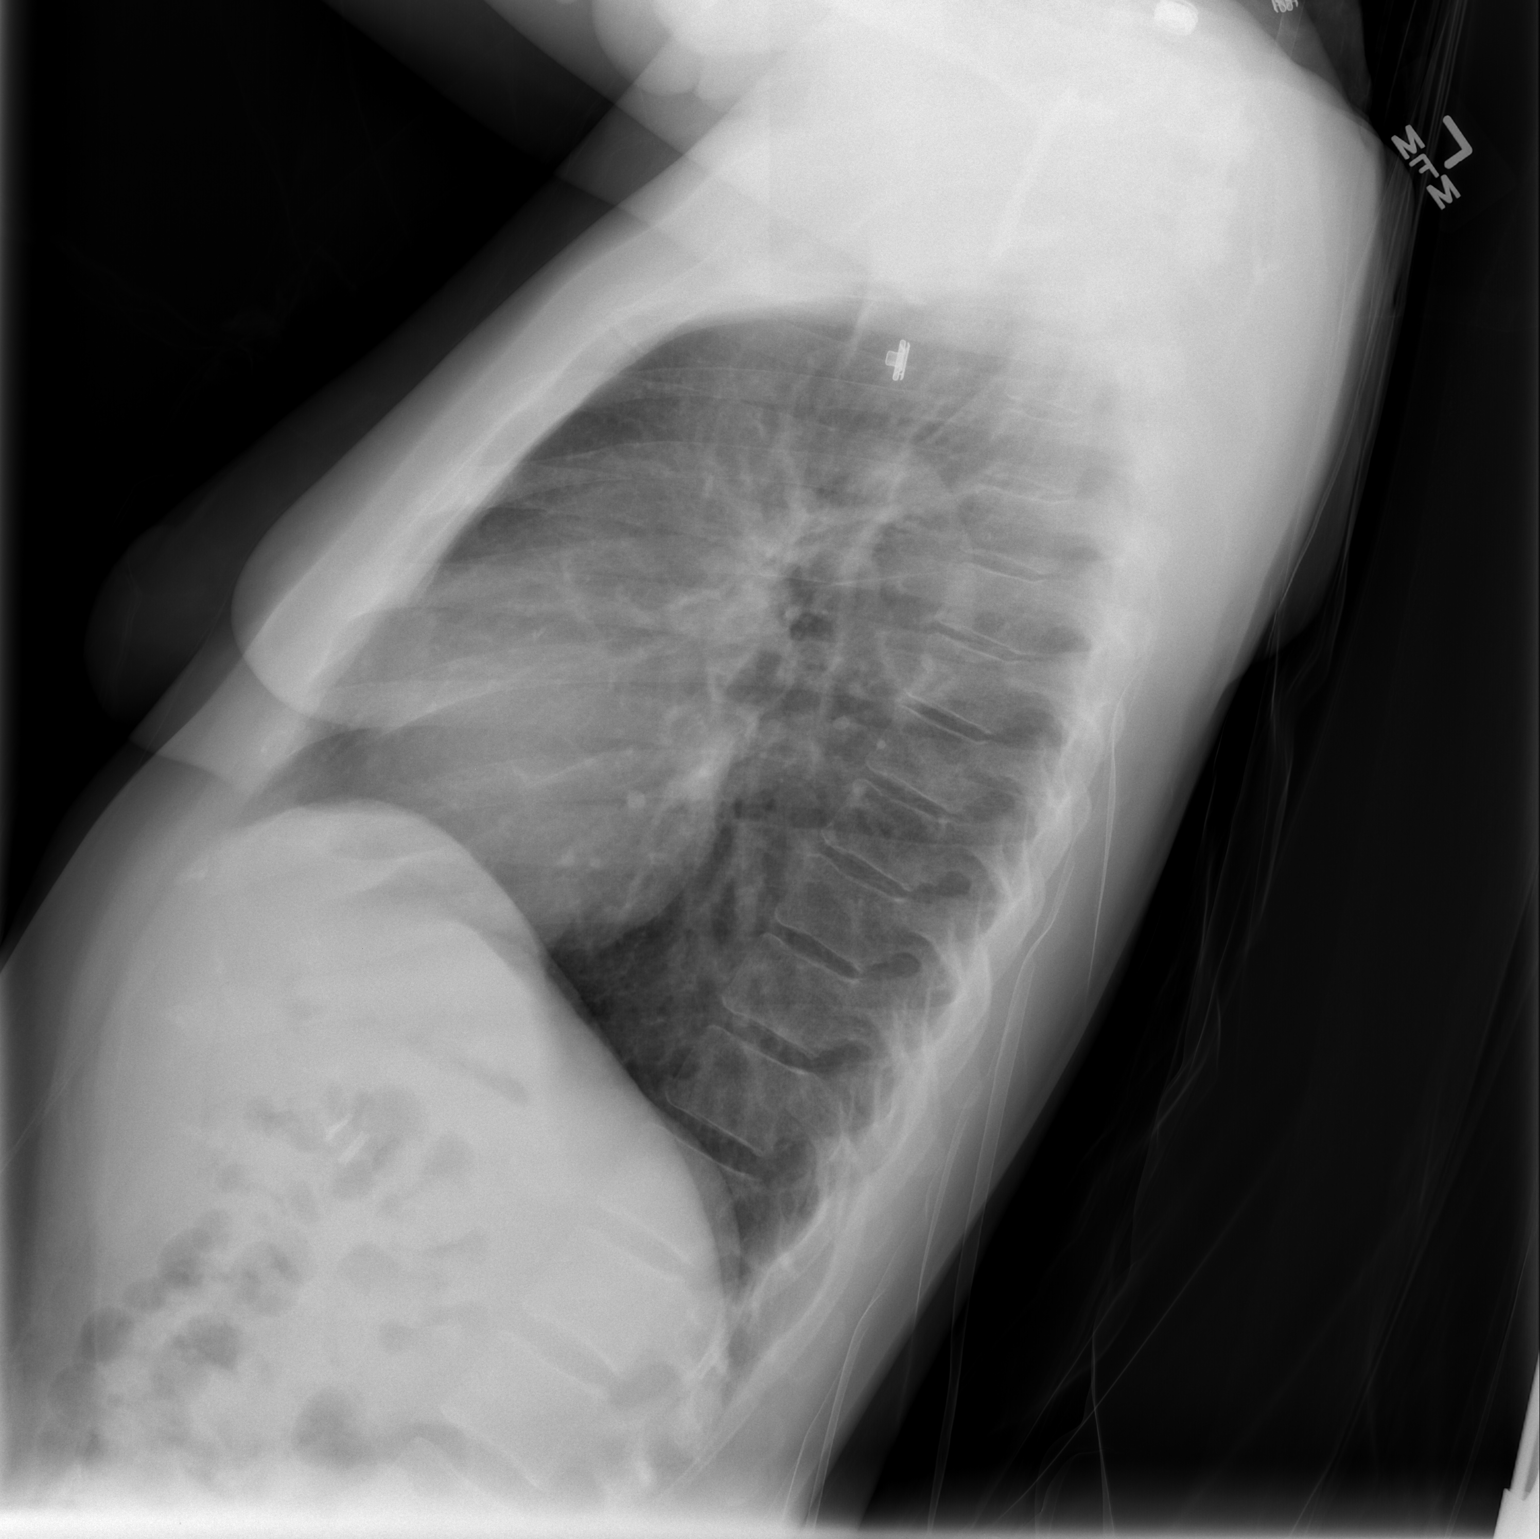

[view not recorded]
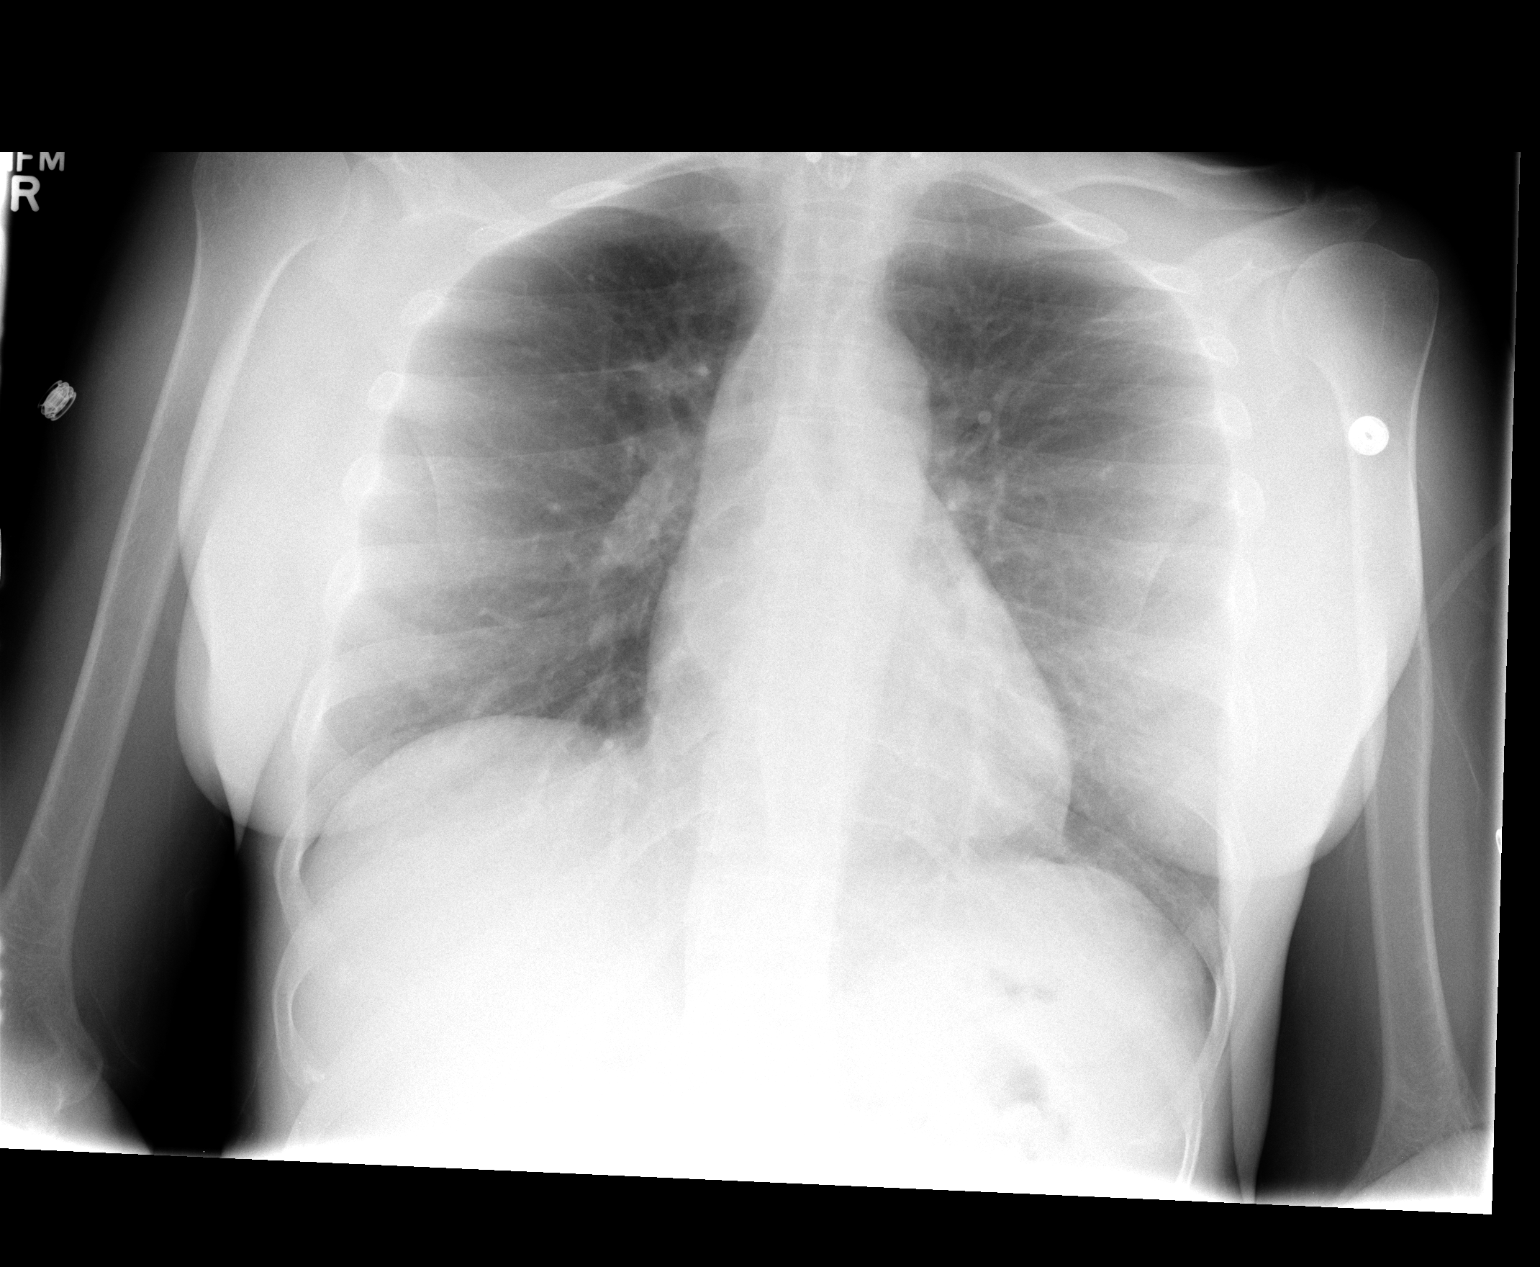

[2 of 2 positions shown; findings below may reference images not displayed]

FINDINGS: Normal heart.  Clear lungs.
IMPRESSION: Negative.

## 2012-10-14 ENCOUNTER — Emergency Department (HOSPITAL_BASED_OUTPATIENT_CLINIC_OR_DEPARTMENT_OTHER)
Admission: EM | Admit: 2012-10-14 | Discharge: 2012-10-14 | Disposition: A | Discharge status: Home / Self Care | Payer: PRIVATE HEALTH INSURANCE | Attending: Emergency Medicine | Admitting: Emergency Medicine

## 2012-10-14 ENCOUNTER — Encounter (HOSPITAL_BASED_OUTPATIENT_CLINIC_OR_DEPARTMENT_OTHER): Payer: Self-pay | Admitting: Emergency Medicine

## 2012-10-14 ENCOUNTER — Emergency Department (HOSPITAL_BASED_OUTPATIENT_CLINIC_OR_DEPARTMENT_OTHER): Payer: PRIVATE HEALTH INSURANCE

## 2012-10-14 DIAGNOSIS — Z9089 Acquired absence of other organs: Secondary | ICD-10-CM | POA: Insufficient documentation

## 2012-10-14 DIAGNOSIS — Z8639 Personal history of other endocrine, nutritional and metabolic disease: Secondary | ICD-10-CM | POA: Insufficient documentation

## 2012-10-14 DIAGNOSIS — R1013 Epigastric pain: Secondary | ICD-10-CM | POA: Insufficient documentation

## 2012-10-14 DIAGNOSIS — K2900 Acute gastritis without bleeding: Secondary | ICD-10-CM | POA: Insufficient documentation

## 2012-10-14 DIAGNOSIS — R197 Diarrhea, unspecified: Secondary | ICD-10-CM | POA: Insufficient documentation

## 2012-10-14 DIAGNOSIS — Z8679 Personal history of other diseases of the circulatory system: Secondary | ICD-10-CM | POA: Insufficient documentation

## 2012-10-14 DIAGNOSIS — Z862 Personal history of diseases of the blood and blood-forming organs and certain disorders involving the immune mechanism: Secondary | ICD-10-CM | POA: Insufficient documentation

## 2012-10-14 DIAGNOSIS — Z7982 Long term (current) use of aspirin: Secondary | ICD-10-CM | POA: Insufficient documentation

## 2012-10-14 DIAGNOSIS — Z79899 Other long term (current) drug therapy: Secondary | ICD-10-CM | POA: Insufficient documentation

## 2012-10-14 DIAGNOSIS — Z8739 Personal history of other diseases of the musculoskeletal system and connective tissue: Secondary | ICD-10-CM | POA: Insufficient documentation

## 2012-10-14 DIAGNOSIS — G8929 Other chronic pain: Secondary | ICD-10-CM | POA: Insufficient documentation

## 2012-10-14 DIAGNOSIS — J45909 Unspecified asthma, uncomplicated: Secondary | ICD-10-CM | POA: Insufficient documentation

## 2012-10-14 DIAGNOSIS — R6883 Chills (without fever): Secondary | ICD-10-CM | POA: Insufficient documentation

## 2012-10-14 LAB — RAPID URINE DRUG SCREEN, HOSP PERFORMED
Amphetamines: NOT DETECTED
Barbiturates: NOT DETECTED
Benzodiazepines: POSITIVE — AB
Tetrahydrocannabinol: NOT DETECTED

## 2012-10-14 LAB — COMPREHENSIVE METABOLIC PANEL
ALT: 36 U/L — ABNORMAL HIGH (ref 0–35)
AST: 27 U/L (ref 0–37)
CO2: 27 mEq/L (ref 19–32)
Calcium: 10 mg/dL (ref 8.4–10.5)
Chloride: 95 mEq/L — ABNORMAL LOW (ref 96–112)
GFR calc Af Amer: >90 mL/min (ref >90)
GFR calc non Af Amer: >90 mL/min (ref >90)
Glucose, Bld: 144 mg/dL — ABNORMAL HIGH (ref 70–99)
Sodium: 136 mEq/L (ref 135–145)
Total Bilirubin: 0.3 mg/dL (ref 0.3–1.2)

## 2012-10-14 LAB — CBC WITH DIFFERENTIAL/PLATELET
Basophils Absolute: 0 10*3/uL (ref 0.0–0.1)
Eosinophils Relative: 0 % (ref 0–5)
HCT: 42.4 % (ref 36.0–46.0)
Lymphocytes Relative: 13 % (ref 12–46)
Lymphs Abs: 2.2 10*3/uL (ref 0.7–4.0)
MCV: 92.2 fL (ref 78.0–100.0)
Monocytes Absolute: 1.1 10*3/uL — ABNORMAL HIGH (ref 0.1–1.0)
Neutro Abs: 13.8 10*3/uL — ABNORMAL HIGH (ref 1.7–7.7)
Platelets: 360 10*3/uL (ref 150–400)
RBC: 4.6 MIL/uL (ref 3.87–5.11)
RDW: 13.3 % (ref 11.5–15.5)
WBC: 17.1 10*3/uL — ABNORMAL HIGH (ref 4.0–10.5)

## 2012-10-14 LAB — URINALYSIS, ROUTINE W REFLEX MICROSCOPIC
Bilirubin Urine: NEGATIVE
Glucose, UA: NEGATIVE mg/dL
Hgb urine dipstick: NEGATIVE
Ketones, ur: NEGATIVE mg/dL
Protein, ur: NEGATIVE mg/dL
Urobilinogen, UA: 1 mg/dL (ref 0.0–1.0)

## 2012-10-14 LAB — MAGNESIUM: Magnesium: 2.2 mg/dL (ref 1.5–2.5)

## 2012-10-14 MED ORDER — PANTOPRAZOLE SODIUM 40 MG IV SOLR
40.0000 mg | Freq: Once | INTRAVENOUS | Status: AC
  Administered 2012-10-14: 40 mg via INTRAVENOUS
  Filled 2012-10-14: qty 40

## 2012-10-14 MED ORDER — HYDROMORPHONE HCL PF 2 MG/ML IJ SOLN
2.0000 mg | Freq: Once | INTRAMUSCULAR | Status: AC
  Administered 2012-10-14: 2 mg via INTRAVENOUS
  Filled 2012-10-14: qty 1

## 2012-10-14 MED ORDER — ONDANSETRON HCL 4 MG/2ML IJ SOLN
4.0000 mg | Freq: Once | INTRAMUSCULAR | Status: AC
Start: 2012-10-14 — End: 2012-10-14
  Administered 2012-10-14: 4 mg via INTRAVENOUS
  Filled 2012-10-14: qty 2

## 2012-10-14 MED ORDER — SODIUM CHLORIDE 0.9 % IV SOLN
INTRAVENOUS | Status: DC
  Administered 2012-10-14: 17:00:00 via INTRAVENOUS

## 2012-10-14 NOTE — ED Provider Notes (Signed)
History  This chart was scribed for Katie Cooper III, MD by Ardeen Jourdain, ED Scribe. This patient was seen in room MH04/MH04 and the patient's care was started at 1647.  CSN: [id number]  Arrival date & time 10/14/12  1552   None     Chief Complaint  Patient presents with  . Nausea  . Abdominal Pain     The history is provided by the patient and the EMS personnel. No language interpreter was used.    HPI Comments: Ricardo MARIBELLE HOPPLE is a 62 y.o. female with a h/o asthma, chronic back pain and hypotension brought in by ambulance, who presents to the Emergency Department complaining of gradual onset, gradually worsening, constant nausea with associated emesis, diarrhea, chills and epigastric pain. She states the symptoms began 2200 last night. She states she may have vomited bile and blood. She reports one intermittent episode of pain under her left breast. She states she became very weak during the night. She admits to urinating a small amount when she vomited. She reports similar symptoms a few years ago. She denies any fever as an associated symptom.    Past Medical History  Diagnosis Date  . Asthma   . Hypokalemia   . Low magnesium levels   . Tachycardia   . Hypotension   . Chronic back pain     Past Surgical History  Procedure Laterality Date  . Cholecystectomy    . Rotator cuff repair      No family history on file.  History  Substance Use Topics  . Smoking status: Never Smoker   . Smokeless tobacco: Not on file  . Alcohol Use: No   No OB history available.   Review of Systems  Gastrointestinal: Positive for nausea, vomiting and abdominal pain.  All other systems reviewed and are negative.    Allergies  Review of patient's allergies indicates no known allergies.  Home Medications   Current Outpatient Rx  Name  Route  Sig  Dispense  Refill  . ALPRAZolam (XANAX) 1 MG tablet   Oral   Take 1 mg by mouth 3 (three) times daily as needed for sleep.          Marland Kitchen aspirin 81 MG tablet   Oral   Take 81 mg by mouth daily.         Marland Kitchen azelastine (ASTELIN) 137 MCG/SPRAY nasal spray   Nasal   Place 1 spray into the nose 2 (two) times daily. Use in each nostril as directed         . budesonide-formoterol (SYMBICORT) 80-4.5 MCG/ACT inhaler   Inhalation   Inhale 2 puffs into the lungs 2 (two) times daily.         . diclofenac sodium (VOLTAREN) 1 % GEL   Topical   Apply topically.         . DULoxetine (CYMBALTA) 60 MG capsule   Oral   Take 60 mg by mouth daily.         Marland Kitchen FOLIC ACID PO   Oral   Take 800 mcg by mouth.         . gabapentin (NEURONTIN) 100 MG capsule   Oral   Take 100 mg by mouth 3 (three) times daily.         Marland Kitchen lidocaine (LIDODERM) 5 %   Transdermal   Place 1 patch onto the skin daily. Remove & Discard patch within 12 hours or as directed by MD         .  omeprazole (PRILOSEC) 40 MG capsule   Oral   Take 40 mg by mouth daily.         Marland Kitchen oxyCODONE-acetaminophen (PERCOCET) 10-325 MG per tablet   Oral   Take 1 tablet by mouth every 6 (six) hours as needed for pain.         . potassium chloride (MICRO-K) 10 MEQ CR capsule   Oral   Take 10 mEq by mouth 2 (two) times daily.         Marland Kitchen rOPINIRole (REQUIP) 1 MG tablet   Oral   Take 3 mg by mouth at bedtime.         . temazepam (RESTORIL) 15 MG capsule   Oral   Take 15 mg by mouth at bedtime as needed for sleep.         Marland Kitchen tolterodine (DETROL LA) 4 MG 24 hr capsule   Oral   Take 4 mg by mouth daily.         Marland Kitchen albuterol (PROVENTIL HFA;VENTOLIN HFA) 108 (90 BASE) MCG/ACT inhaler   Inhalation   Inhale 2 puffs into the lungs every 6 (six) hours as needed.           Marland Kitchen atenolol (TENORMIN) 25 MG tablet   Oral   Take 25 mg by mouth 2 (two) times daily.           . carisoprodol (SOMA) 350 MG tablet   Oral   Take 350 mg by mouth 3 (three) times daily as needed.           . Fluticasone-Salmeterol (ADVAIR) 100-50 MCG/DOSE AEPB   Inhalation    Inhale 1 puff into the lungs every 12 (twelve) hours.           Marland Kitchen oxyCODONE (OXYCONTIN) 40 MG 12 hr tablet   Oral   Take 40 mg by mouth 3 (three) times daily.           . promethazine (PHENERGAN) 25 MG tablet   Oral   Take 25 mg by mouth every 6 (six) hours as needed.             Triage Vitals: BP 156/70  Pulse 81  Temp(Src) 98.3 F (36.8 C) (Oral)  Resp 18  Ht 5\' 9"  (1.753 m)  Wt 250 lb (113.399 kg)  BMI 36.9 kg/m2  SpO2 97%  Physical Exam  Nursing note and vitals reviewed. Constitutional: She is oriented to person, place, and time. She appears well-developed and well-nourished. No distress.  Obese  HENT:  Head: Normocephalic and atraumatic.  Right Ear: External ear normal.  Left Ear: External ear normal.  Mouth/Throat: Oropharynx is clear and moist. No oropharyngeal exudate.  TMs normal bilaterally  Eyes: Conjunctivae and EOM are normal. Pupils are equal, round, and reactive to light.  Neck: Normal range of motion. Neck supple. No tracheal deviation present.  Cardiovascular: Normal rate, regular rhythm and normal heart sounds.  Exam reveals no gallop and no friction rub.   No murmur heard. Pulmonary/Chest: Effort normal and breath sounds normal. No respiratory distress. She has no wheezes. She has no rales. She exhibits no tenderness.  Abdominal: Soft. Bowel sounds are normal. She exhibits no distension and no mass. There is tenderness. There is no rebound and no guarding.  Mild epigastric tenderness   Musculoskeletal: Normal range of motion. She exhibits no edema and no tenderness.  Neurological: She is alert and oriented to person, place, and time. No cranial nerve deficit.  Neurologically intact  Skin: Skin is warm and dry. She is not diaphoretic.  Psychiatric: She has a normal mood and affect. Her behavior is normal.    ED Course  Procedures (including critical care time)  DIAGNOSTIC STUDIES: Oxygen Saturation is 97% on room air, normal by my  interpretation.    COORDINATION OF CARE:  5:02 PM-Discussed treatment plan which includes CBC, CMP, EKG, IV fluids and pain medication with pt at bedside and pt agreed to plan.    6:34 PM: Pt rechecked, she seems normal and comfortable, PO challenge with clear liquids started, lab results discussed.     Date: 10/14/2012  Rate: 87  Rhythm: normal sinus rhythm  QRS Axis: normal  Intervals: normal  ST/T Wave abnormalities: normal  Conduction Disutrbances:none  Narrative Interpretation: NOrmal  Old EKG Reviewed: none available  Results for orders placed during the hospital encounter of 10/14/12  CBC WITH DIFFERENTIAL      Result Value Range   WBC 17.1 (*) 4.0 - 10.5 K/uL   RBC 4.60  3.87 - 5.11 MIL/uL   Hemoglobin 14.6  12.0 - 15.0 g/dL   HCT 16.1  09.6 - 04.5 %   MCV 92.2  78.0 - 100.0 fL   MCH 31.7  26.0 - 34.0 pg   MCHC 34.4  30.0 - 36.0 g/dL   RDW 40.9  81.1 - 91.4 %   Platelets 360  150 - 400 K/uL   Neutrophils Relative % 81 (*) 43 - 77 %   Neutro Abs 13.8 (*) 1.7 - 7.7 K/uL   Lymphocytes Relative 13  12 - 46 %   Lymphs Abs 2.2  0.7 - 4.0 K/uL   Monocytes Relative 6  3 - 12 %   Monocytes Absolute 1.1 (*) 0.1 - 1.0 K/uL   Eosinophils Relative 0  0 - 5 %   Eosinophils Absolute 0.0  0.0 - 0.7 K/uL   Basophils Relative 0  0 - 1 %   Basophils Absolute 0.0  0.0 - 0.1 K/uL  COMPREHENSIVE METABOLIC PANEL      Result Value Range   Sodium 136  135 - 145 mEq/L   Potassium 3.8  3.5 - 5.1 mEq/L   Chloride 95 (*) 96 - 112 mEq/L   CO2 27  19 - 32 mEq/L   Glucose, Bld 144 (*) 70 - 99 mg/dL   BUN 12  6 - 23 mg/dL   Creatinine, Ser 7.82  0.50 - 1.10 mg/dL   Calcium 95.6  8.4 - 21.3 mg/dL   Total Protein 7.9  6.0 - 8.3 g/dL   Albumin 4.2  3.5 - 5.2 g/dL   AST 27  0 - 37 U/L   ALT 36 (*) 0 - 35 U/L   Alkaline Phosphatase 113  39 - 117 U/L   Total Bilirubin 0.3  0.3 - 1.2 mg/dL   GFR calc non Af Amer >90  >90 mL/min   GFR calc Af Amer >90  >90 mL/min  LIPASE, BLOOD       Result Value Range   Lipase 13  11 - 59 U/L  URINALYSIS, ROUTINE W REFLEX MICROSCOPIC      Result Value Range   Color, Urine YELLOW  YELLOW   APPearance CLOUDY (*) CLEAR   Specific Gravity, Urine 1.021  1.005 - 1.030   pH 8.0  5.0 - 8.0   Glucose, UA NEGATIVE  NEGATIVE mg/dL   Hgb urine dipstick NEGATIVE  NEGATIVE   Bilirubin Urine NEGATIVE  NEGATIVE   Ketones,  ur NEGATIVE  NEGATIVE mg/dL   Protein, ur NEGATIVE  NEGATIVE mg/dL   Urobilinogen, UA 1.0  0.0 - 1.0 mg/dL   Nitrite NEGATIVE  NEGATIVE   Leukocytes, UA NEGATIVE  NEGATIVE  ETHANOL      Result Value Range   Alcohol, Ethyl (B) <11  0 - 11 mg/dL  URINE RAPID DRUG SCREEN (HOSP PERFORMED)      Result Value Range   Opiates POSITIVE (*) NONE DETECTED   Cocaine NONE DETECTED  NONE DETECTED   Benzodiazepines POSITIVE (*) NONE DETECTED   Amphetamines NONE DETECTED  NONE DETECTED   Tetrahydrocannabinol NONE DETECTED  NONE DETECTED   Barbiturates NONE DETECTED  NONE DETECTED  MAGNESIUM      Result Value Range   Magnesium 2.2  1.5 - 2.5 mg/dL   Dg Abd Acute W/chest  10/14/2012   *RADIOLOGY REPORT*  Clinical Data: Abdominal pain.  Severe vomiting and diarrhea.  ACUTE ABDOMEN SERIES (ABDOMEN 2 VIEW & CHEST 1 VIEW)  Comparison: 03/17/2011  Findings: No evidence of dilated bowel loops or free air.  Surgical clips seen from prior cholecystectomy.  No radiopaque calculi identified.  No evidence of abnormal mass effect.  Heart size is normal.  Both lungs are clear.  IMPRESSION:  1.  No acute findings.  Unremarkable bowel gas pattern. 2.  No active cardiopulmonary disease.   Original Report Authenticated By: Myles Rosenthal, M.D.   Lab workup was essentially negative.  She received IV fluids, IV Dilaudid, Zofran, and Protonix, and over 2 hours her symptoms resolved and she could tolerate ice water.  Advised clear liquids today, try solid foods tomorrow.       1. Acute gastritis     I personally performed the services described in this  documentation, which was scribed in my presence. The recorded information has been reviewed and is accurate.  Osvaldo Human, MD      Katie Cooper III, MD 10/14/12 [phone]

## 2012-10-14 NOTE — ED Notes (Signed)
MD at bedside. 

## 2012-10-14 NOTE — ED Notes (Signed)
Pt brought via [facility] with c/o n/v and epigastric pain since 11pm last night. Per EMS, pt 12-lead unremarkable, 22G to left hand, zofran 4 mg given twice (8mg  total). Pt did not have episode of vomiting in Ems presence. Pt requesting pain medication en route and upon arrival to dept.

## 2020-10-19 ENCOUNTER — Emergency Department (HOSPITAL_COMMUNITY): Payer: Medicare (Managed Care)

## 2020-10-19 ENCOUNTER — Other Ambulatory Visit: Payer: Self-pay

## 2020-10-19 ENCOUNTER — Encounter (HOSPITAL_COMMUNITY): Payer: Self-pay

## 2020-10-19 ENCOUNTER — Emergency Department (HOSPITAL_COMMUNITY)
Admission: EM | Admit: 2020-10-19 | Discharge: 2020-10-20 | Disposition: A | Discharge status: Home / Self Care | Payer: Medicare (Managed Care) | Attending: Emergency Medicine | Admitting: Emergency Medicine

## 2020-10-19 DIAGNOSIS — Z7951 Long term (current) use of inhaled steroids: Secondary | ICD-10-CM | POA: Diagnosis not present

## 2020-10-19 DIAGNOSIS — W01198A Fall on same level from slipping, tripping and stumbling with subsequent striking against other object, initial encounter: Secondary | ICD-10-CM | POA: Insufficient documentation

## 2020-10-19 DIAGNOSIS — R42 Dizziness and giddiness: Secondary | ICD-10-CM | POA: Diagnosis not present

## 2020-10-19 DIAGNOSIS — S51012A Laceration without foreign body of left elbow, initial encounter: Secondary | ICD-10-CM | POA: Diagnosis not present

## 2020-10-19 DIAGNOSIS — S0001XA Abrasion of scalp, initial encounter: Secondary | ICD-10-CM | POA: Diagnosis not present

## 2020-10-19 DIAGNOSIS — S0990XA Unspecified injury of head, initial encounter: Secondary | ICD-10-CM

## 2020-10-19 DIAGNOSIS — Y92511 Restaurant or cafe as the place of occurrence of the external cause: Secondary | ICD-10-CM | POA: Diagnosis not present

## 2020-10-19 DIAGNOSIS — Z79899 Other long term (current) drug therapy: Secondary | ICD-10-CM | POA: Diagnosis not present

## 2020-10-19 DIAGNOSIS — Z7982 Long term (current) use of aspirin: Secondary | ICD-10-CM | POA: Diagnosis not present

## 2020-10-19 DIAGNOSIS — W19XXXA Unspecified fall, initial encounter: Secondary | ICD-10-CM

## 2020-10-19 DIAGNOSIS — S59902A Unspecified injury of left elbow, initial encounter: Secondary | ICD-10-CM | POA: Diagnosis present

## 2020-10-19 DIAGNOSIS — J45909 Unspecified asthma, uncomplicated: Secondary | ICD-10-CM | POA: Insufficient documentation

## 2020-10-19 LAB — CBC
HCT: 33 % — ABNORMAL LOW (ref 36.0–46.0)
Hemoglobin: 11.2 g/dL — ABNORMAL LOW (ref 12.0–15.0)
MCH: 33 pg (ref 26.0–34.0)
MCHC: 33.9 g/dL (ref 30.0–36.0)
MCV: 97.3 fL (ref 80.0–100.0)
Platelets: 230 10*3/uL (ref 150–400)
RBC: 3.39 MIL/uL — ABNORMAL LOW (ref 3.87–5.11)
RDW: 13.8 % (ref 11.5–15.5)
WBC: 6.2 10*3/uL (ref 4.0–10.5)
nRBC: 0 % (ref 0.0–0.2)

## 2020-10-19 LAB — COMPREHENSIVE METABOLIC PANEL
ALT: 54 U/L — ABNORMAL HIGH (ref 0–44)
AST: 45 U/L — ABNORMAL HIGH (ref 15–41)
Albumin: 4.2 g/dL (ref 3.5–5.0)
Alkaline Phosphatase: 63 U/L (ref 38–126)
Anion gap: 8 (ref 5–15)
BUN: 10 mg/dL (ref 8–23)
CO2: 27 mmol/L (ref 22–32)
Calcium: 8.7 mg/dL — ABNORMAL LOW (ref 8.9–10.3)
Chloride: 93 mmol/L — ABNORMAL LOW (ref 98–111)
Creatinine, Ser: 0.55 mg/dL (ref 0.44–1.00)
GFR, Estimated: >60 mL/min (ref >60)
Glucose, Bld: 82 mg/dL (ref 70–99)
Potassium: 3.9 mmol/L (ref 3.5–5.1)
Sodium: 128 mmol/L — ABNORMAL LOW (ref 135–145)
Total Bilirubin: 0.5 mg/dL (ref 0.3–1.2)
Total Protein: 6.9 g/dL (ref 6.5–8.1)

## 2020-10-19 LAB — URINALYSIS, ROUTINE W REFLEX MICROSCOPIC
Bacteria, UA: NONE SEEN
Bilirubin Urine: NEGATIVE
Glucose, UA: NEGATIVE mg/dL
Hgb urine dipstick: NEGATIVE
Ketones, ur: NEGATIVE mg/dL
Nitrite: NEGATIVE
Protein, ur: NEGATIVE mg/dL
Specific Gravity, Urine: 1.005 (ref 1.005–1.030)
pH: 6 (ref 5.0–8.0)

## 2020-10-19 LAB — ETHANOL: Alcohol, Ethyl (B): 34 mg/dL — ABNORMAL HIGH (ref <10)

## 2020-10-19 NOTE — ED Provider Notes (Signed)
[facility]-EMERGENCY DEPT Provider Note   CSN: [id number] Arrival date & time: 10/19/20  2210     History Chief Complaint  Patient presents with  . Fall  . Laceration    Left elbow    Katie Lloyd is a 70 y.o. female.  The history is provided by the patient and medical records.    70 y.o. F with hx of asthma, chronic back pain, hx episodic hypotension and subsequent dizziness, presenting to the ED following a fall.  [place]  She did have some EtOH.  States she was leaned over talking to someone and all of a sudden got dizzy/lightheaded and fell backwards.  She struck her head on concrete but denies LOC.  She has abrasion to posterior scalp and reported laceration to left elbow.  She is not currently on anticoagulation.  She does report history of SDH in November 2021.  Tetanus UTD.  She was able to ambulate to restroom here in the ED.  Past Medical History:  Diagnosis Date  . Asthma   . Chronic back pain   . Hypokalemia   . Hypotension   . Low magnesium levels   . Tachycardia     There are no problems to display for this patient.   Past Surgical History:  Procedure Laterality Date  . CHOLECYSTECTOMY    . ROTATOR CUFF REPAIR       OB History   No obstetric history on file.     History reviewed. No pertinent family history.  Social History   Tobacco Use  . Smoking status: Never Smoker  Substance Use Topics  . Alcohol use: No  . Drug use: No    Home Medications Prior to Admission medications   Medication Sig Start Date End Date Taking? Authorizing Provider  albuterol (PROVENTIL HFA;VENTOLIN HFA) 108 (90 BASE) MCG/ACT inhaler Inhale 2 puffs into the lungs every 6 (six) hours as needed.      [provider]  ALPRAZolam Prudy Feeler) 1 MG tablet Take 1 mg by mouth 3 (three) times daily as needed for sleep.    [provider]  aspirin 81 MG tablet Take 81 mg by mouth daily.     [provider]  atenolol (TENORMIN) 25 MG tablet Take 25 mg by mouth 2 (two) times daily.      [provider]  azelastine (ASTELIN) 137 MCG/SPRAY nasal spray Place 1 spray into the nose 2 (two) times daily. Use in each nostril as directed    [provider]  budesonide-formoterol (SYMBICORT) 80-4.5 MCG/ACT inhaler Inhale 2 puffs into the lungs 2 (two) times daily.    [provider]  carisoprodol (SOMA) 350 MG tablet Take 350 mg by mouth 3 (three) times daily as needed.      [provider]  diclofenac sodium (VOLTAREN) 1 % GEL Apply topically.    [provider]  DULoxetine (CYMBALTA) 60 MG capsule Take 60 mg by mouth daily.    [provider]  Fluticasone-Salmeterol (ADVAIR) 100-50 MCG/DOSE AEPB Inhale 1 puff into the lungs every 12 (twelve) hours.      [provider]  FOLIC ACID PO Take 800 mcg by mouth.    [provider]  gabapentin (NEURONTIN) 100 MG capsule Take 100 mg by mouth 3 (three) times daily.    [provider]  lidocaine (LIDODERM) 5 % Place 1 patch onto the skin daily. Remove & Discard patch within 12 hours  or as directed by MD    [provider]  omeprazole (PRILOSEC) 40 MG capsule Take 40 mg by mouth daily.    [provider]  oxyCODONE (OXYCONTIN) 40 MG 12 hr tablet Take 40 mg by mouth 3 (three) times daily.      [provider]  oxyCODONE-acetaminophen (PERCOCET) 10-325 MG per tablet Take 1 tablet by mouth every 6 (six) hours as needed for pain.    [provider]  potassium chloride (MICRO-K) 10 MEQ CR capsule Take 10 mEq by mouth 2 (two) times daily.    [provider]  promethazine (PHENERGAN) 25 MG tablet Take 25 mg by mouth every 6 (six) hours as needed.      [provider]  rOPINIRole (REQUIP) 1 MG tablet Take 3 mg by mouth at bedtime.    [provider]  temazepam (RESTORIL) 15 MG capsule Take 15 mg by mouth at  bedtime as needed for sleep.    [provider]  tolterodine (DETROL LA) 4 MG 24 hr capsule Take 4 mg by mouth daily.    [provider]    Allergies    Patient has no known allergies.  Review of Systems   Review of Systems  Skin: Positive for wound.  Neurological: Positive for light-headedness.  All other systems reviewed and are negative.   Physical Exam Updated Vital Signs BP 124/75 (BP Location: Right Arm)   Pulse 68   Temp (!) 97.2 F (36.2 C) (Oral)   Resp 16   Ht 5\' 9"  (1.753 m)   Wt 77.6 kg   SpO2 99%   BMI 25.25 kg/m   Physical Exam Vitals and nursing note reviewed.  Constitutional:      Appearance: She is well-developed.  HENT:     Head: Normocephalic and atraumatic.     Comments: Abrasion noted to occipital scalp, no open wound/laceration, no active bleeding, hematoma present Eyes:     Conjunctiva/sclera: Conjunctivae normal.     Pupils: Pupils are equal, round, and reactive to light.  Neck:     Comments: Full ROM, no tenderness to palpation, no deformity Cardiovascular:     Rate and Rhythm: Normal rate and regular rhythm.     Heart sounds: Normal heart sounds.  Pulmonary:     Effort: Pulmonary effort is normal. No respiratory distress.     Breath sounds: Normal breath sounds. No rhonchi.  Abdominal:     General: Bowel sounds are normal.     Palpations: Abdomen is soft.     Tenderness: There is no abdominal tenderness. There is no rebound.  Musculoskeletal:        General: Normal range of motion.     Cervical back: Full passive range of motion without pain and normal range of motion.     Comments: 3cm laceration noted to left elbow, wound remains grossly well approximated, no bony deformity, no significant pain with ROM, arm is NVI  Skin:    General: Skin is warm and dry.  Neurological:     Mental Status: She is alert and oriented to person, place, and time.     Comments: AAOx3, answering questions and following commands  appropriately; equal strength UE and LE bilaterally; CN grossly intact; moves all extremities appropriately without ataxia; no focal neuro deficits or facial asymmetry appreciated     ED Results / Procedures / Treatments   Labs (all labs ordered are listed, but only abnormal results are displayed) Labs Reviewed  CBC - Abnormal;  Notable for the following components:      Result Value   RBC 3.39 (*)    Hemoglobin 11.2 (*)    HCT 33.0 (*)    All other components within normal limits  URINALYSIS, ROUTINE W REFLEX MICROSCOPIC - Abnormal; Notable for the following components:   Color, Urine STRAW (*)    Leukocytes,Ua TRACE (*)    All other components within normal limits  COMPREHENSIVE METABOLIC PANEL - Abnormal; Notable for the following components:   Sodium 128 (*)    Chloride 93 (*)    Calcium 8.7 (*)    AST 45 (*)    ALT 54 (*)    All other components within normal limits  ETHANOL - Abnormal; Notable for the following components:   Alcohol, Ethyl (B) 34 (*)    All other components within normal limits  CBG MONITORING, ED    EKG None  Radiology DG ELBOW COMPLETE LEFT (3+VIEW)  Result Date: 10/19/2020 CLINICAL DATA:  Fall with elbow laceration. EXAM: LEFT ELBOW - COMPLETE 3+ VIEW COMPARISON:  None. FINDINGS: There is no evidence of fracture, dislocation, or joint effusion. Lateral view is limited by positioning. Minimal degenerative spurring. Posterior soft tissue edema. No radiopaque foreign body. No tracking soft tissue air. IMPRESSION: 1. Posterior soft tissue edema without acute fracture or subluxation. 2. Mild degenerative spurring. Electronically Signed   By: Narda Rutherford M.D.   On: 10/19/2020 23:12   CT Head Wo Contrast  Result Date: 10/19/2020 CLINICAL DATA:  Head trauma. EXAM: CT HEAD WITHOUT CONTRAST TECHNIQUE: Contiguous axial images were obtained from the base of the skull through the vertex without intravenous contrast. COMPARISON:  None. FINDINGS: Brain: Patchy  and confluent areas of decreased attenuation are noted throughout the deep and periventricular white matter of the cerebral hemispheres bilaterally, compatible with chronic microvascular ischemic disease. No evidence of large-territorial acute infarction. No parenchymal hemorrhage. No mass lesion. No extra-axial collection. No mass effect or midline shift. No hydrocephalus. Basilar cisterns are patent. Vascular: No hyperdense vessel. Atherosclerotic calcifications are present within the cavernous internal carotid arteries. Skull: No acute fracture or focal lesion. Sinuses/Orbits: Paranasal sinuses and mastoid air cells are clear. The orbits are unremarkable. Other: Left occipital scalp hematoma measuring up to 9 mm. IMPRESSION: 1. No acute intracranial abnormality. 2. A 39mm left occipital scalp hematoma. Electronically Signed   By: Tish Frederickson M.D.   On: 10/19/2020 23:29    Procedures Procedures   LACERATION REPAIR Performed by: Garlon Hatchet Authorized by: Garlon Hatchet Consent: Verbal consent obtained. Risks and benefits: risks, benefits and alternatives were discussed Consent given by: patient Patient identity confirmed: provided demographic data Prepped and Draped in normal sterile fashion Wound explored  Laceration Location: left elbow  Laceration Length: 3cm  No Foreign Bodies seen or palpated  Anesthesia: local infiltration  Local anesthetic: lidocaine 1% without epinephrine  Anesthetic total: 3 ml  Irrigation method: syringe Amount of cleaning: standard  Skin closure: 4-0 prolene  Number of sutures: 3  Technique: simple interrupted  Patient tolerance: Patient tolerated the procedure well with no immediate complications.   Medications Ordered in ED Medications - No data to display  ED Course  I have reviewed the triage vital signs and the nursing notes.  Pertinent labs & imaging results that were available during my care of the patient were reviewed by me  and considered in my medical decision making (see chart for details).    MDM Rules/Calculators/A&P  70 year old female presenting to the  ED [place].  States she was talking to a friend when she began to feel lightheaded and fell.  Struck the back of her head on concrete but denies loss of consciousness.  She has a history of same related to orthostatic hypotension.  Does have some EtOH on board this evening.  On arrival to ED she is awake, alert, appropriately oriented.  She was able to ambulate to the bathroom and urinate independently.  Denies any dizziness or feelings of syncope while walking.  Neurologic exam is non-focal.  She does have abrasion and hematoma to occipital scalp as well as laceration to left elbow.  She is not currently on anticoagulation but does have history of subdural hematoma in November 2021.  Labs sent, will obtain CT of the head and films of left elbow.  Her tetanus is up-to-date.  Imaging is negative aside from scalp hematoma.  Elbow films also negative.  Labs as above-- does have hyponatremia at 128 but this appears chronic with multiple prior values for same.  Ethanol 34.  Laceration repaired as above, tolerated well.  Discussed home wound care.  She remains AAOx3 here without focal deficits.  Feel she is stable for discharge.  She will follow-up with primary care for suture removal in 1 week.  Return here for new concerns.  Final Clinical Impression(s) / ED Diagnoses Final diagnoses:  Fall, initial encounter  Injury of head, initial encounter  Laceration of left elbow, initial encounter    Rx / DC Orders ED Discharge Orders    None       Garlon HatchetSanders, [place] Nordquist M, PA-C 10/20/20 [id number]    Lorre NickAllen, Anthony, MD 10/22/20 [id number]

## 2020-10-19 NOTE — ED Triage Notes (Signed)
Brought in by EMS from hotel at a convention. C/o dizziness and a fall, no LOC. Small abrasion to back of head and laceration to left elbow. Pt has had ETOH tonight. Hx of hypotension, subdural hematoma a few months ago.

## 2020-10-19 NOTE — ED Notes (Signed)
Pt to CT via stretcher

## 2020-10-20 MED ORDER — LIDOCAINE HCL (PF) 1 % IJ SOLN
5.0000 mL | Freq: Once | INTRAMUSCULAR | Status: AC
  Administered 2020-10-20: 5 mL via INTRADERMAL
  Filled 2020-10-20: qty 30

## 2020-10-20 NOTE — Discharge Instructions (Signed)
Keep wound on elbow clean with soap and warm water. Follow-up with your primary care doctor in 1 week to have sutures removed. Return here for new concerns.

## 2022-08-20 IMAGING — CT CT HEAD W/O CM
3 series · 15 of 47 positions shown, 18 images · non-contrast
Comparison: None.

CLINICAL DATA: Head trauma.

EXAM:
CT HEAD WITHOUT CONTRAST
TECHNIQUE: Contiguous axial images were obtained from the base of the skull
through the vertex without intravenous contrast.

[Series 2: head wo · axial · 0.47mm/px · z∈[-66,+64]mm · 9 of 32 slices shown, 12 images]
[im 3/32  brain]
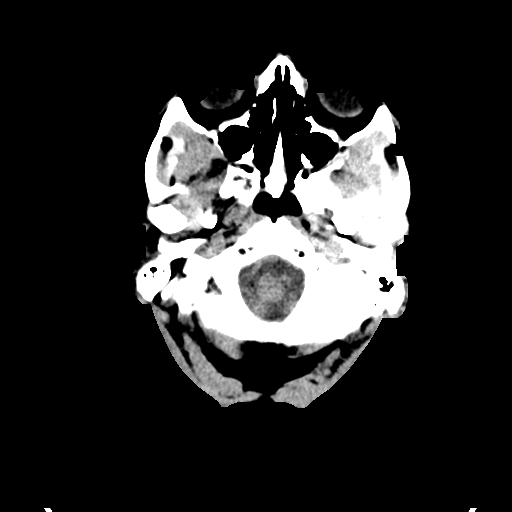
[im 3/32  bone]
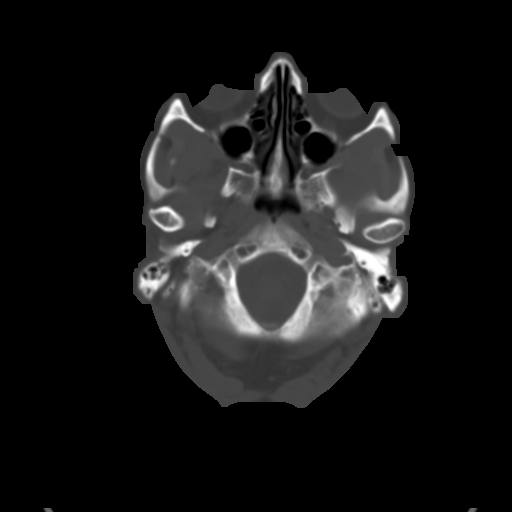
[im 6/32  brain]
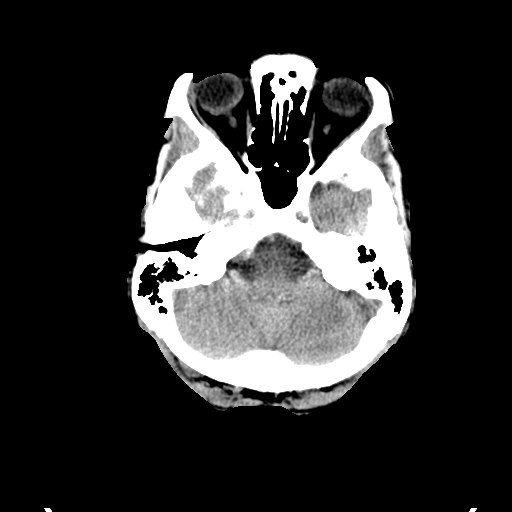
[im 9/32  brain]
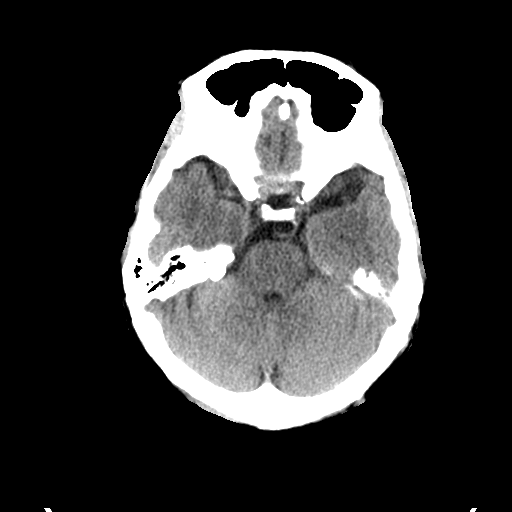
[im 12/32  brain]
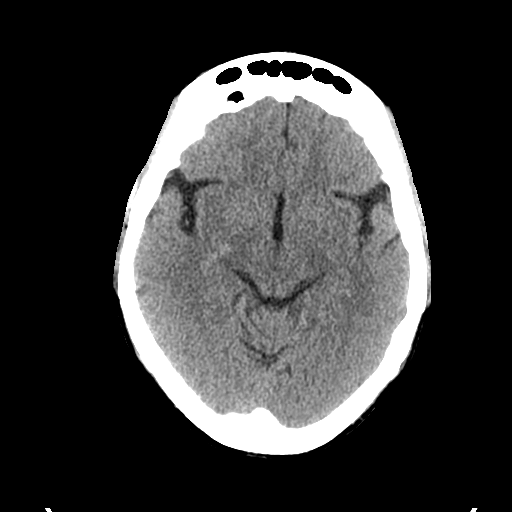
[im 17/32  brain]
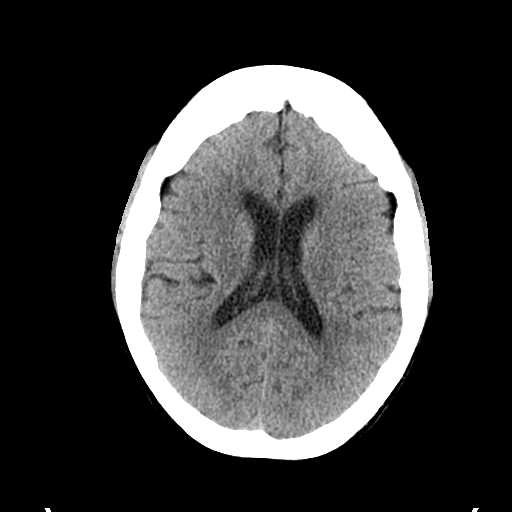
[im 17/32  bone]
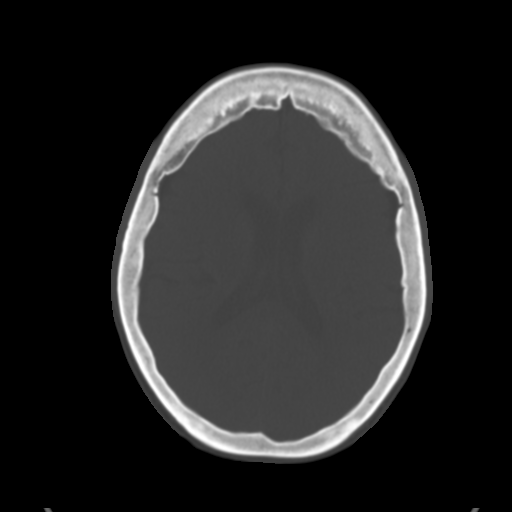
[im 20/32  brain]
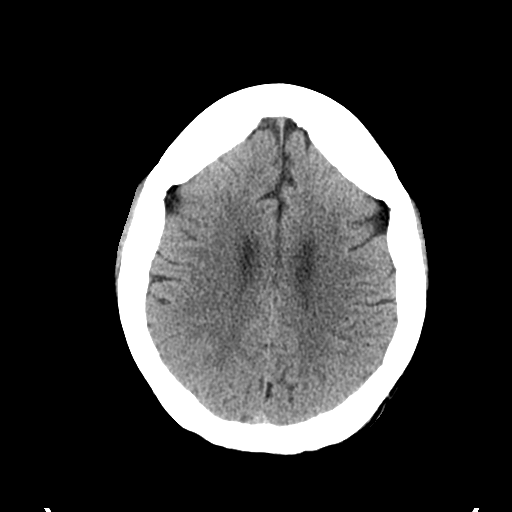
[im 23/32  brain]
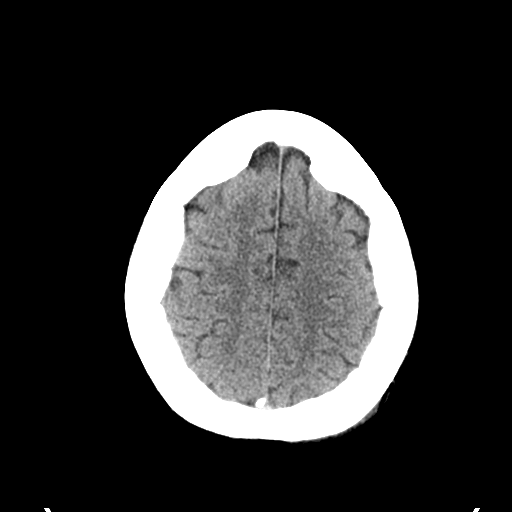
[im 26/32  brain]
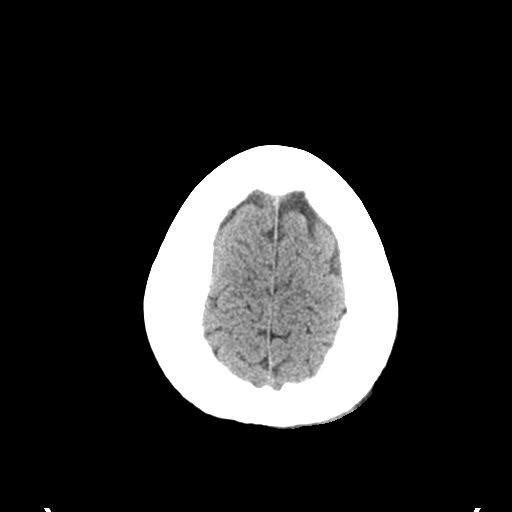
[im 29/32  brain]
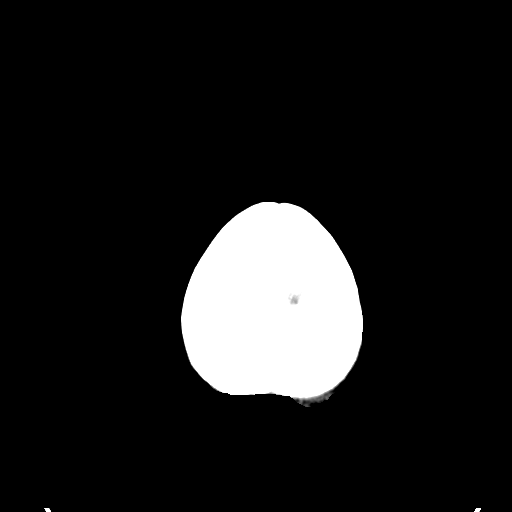
[im 29/32  bone]
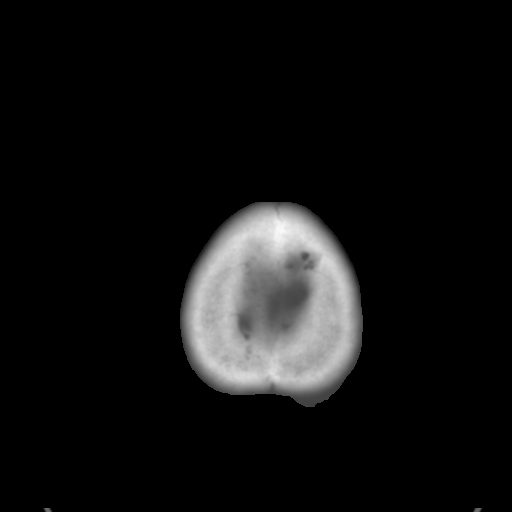

[Series 4: coronal soft tissue · coronal · 0.32mm/px · 3 of 70 slices shown]
[im 24/70  brain]
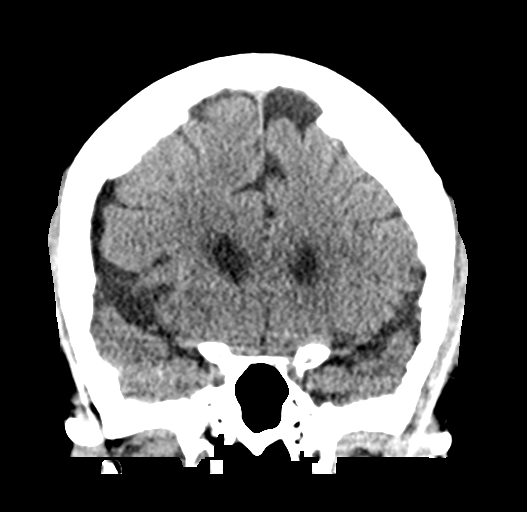
[im 31/70  brain]
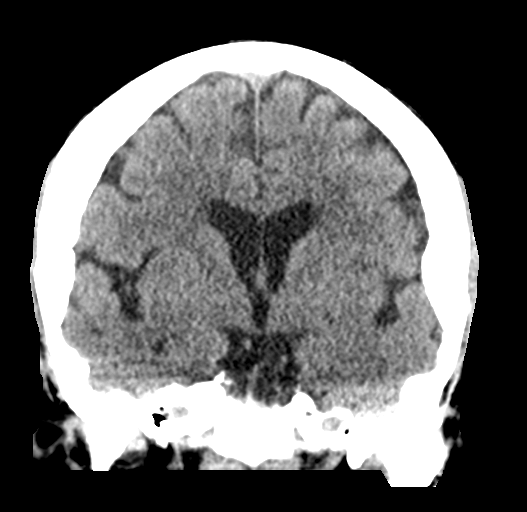
[im 39/70  brain]
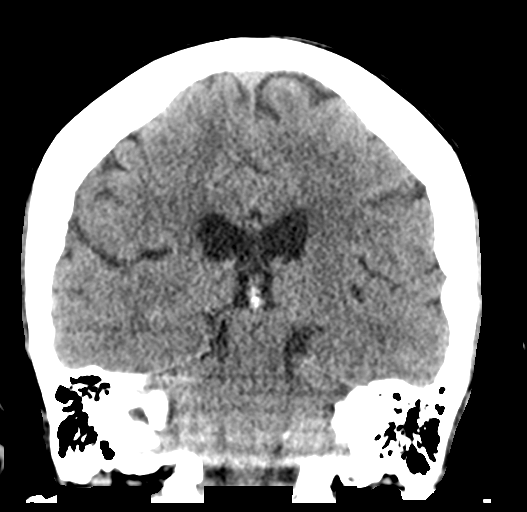

[Series 5: sagittal soft tissue · sagittal · 0.31mm/px · 3 of 58 slices shown]
[im 20/58  brain]
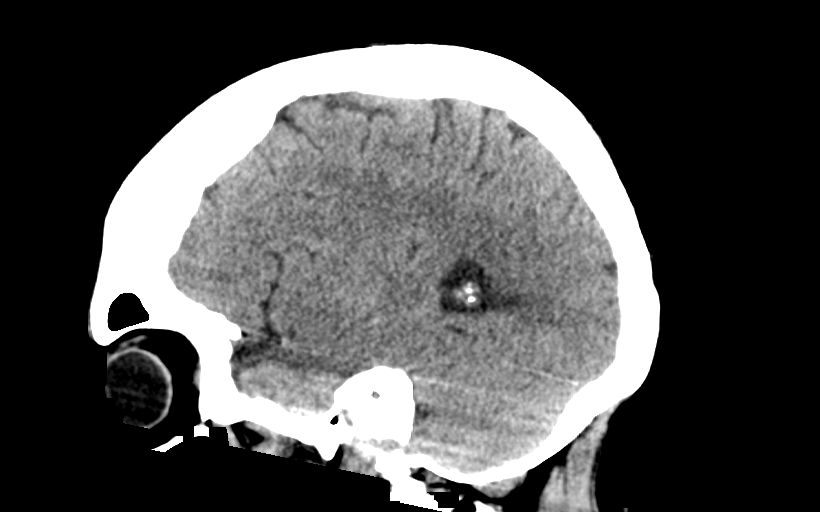
[im 29/58  brain]
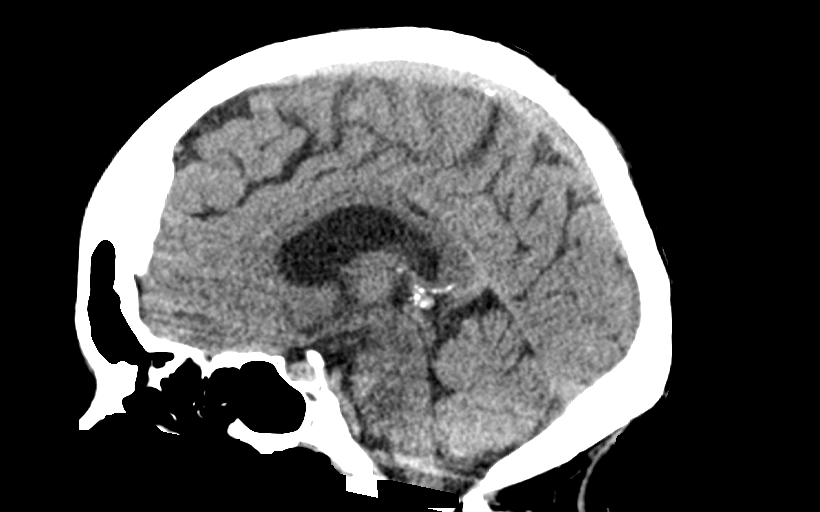
[im 39/58  brain]
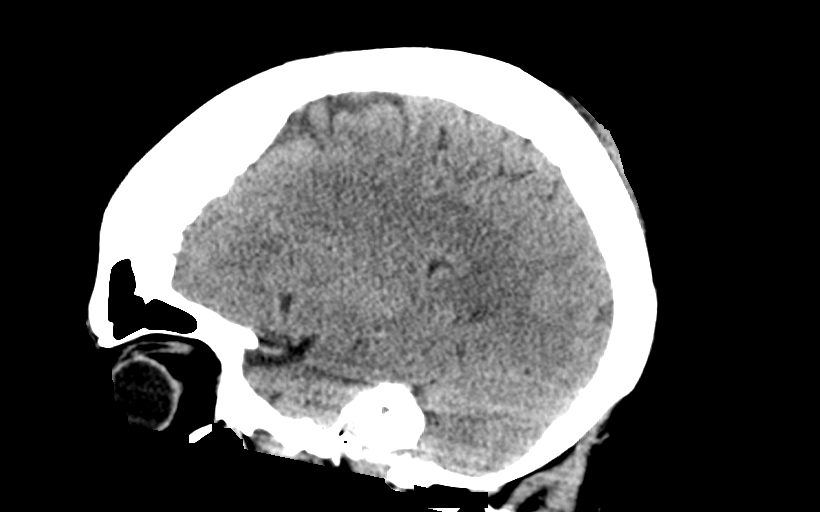

[15 of 47 positions shown; findings below may reference images not displayed]

FINDINGS: Brain:

Patchy and confluent areas of decreased attenuation are noted
throughout the deep and periventricular white matter of the cerebral
hemispheres bilaterally, compatible with chronic microvascular
ischemic disease.

No evidence of large-territorial acute infarction. No parenchymal
hemorrhage. No mass lesion. No extra-axial collection.

No mass effect or midline shift. No hydrocephalus. Basilar cisterns
are patent.

Vascular: No hyperdense vessel. Atherosclerotic calcifications are
present within the cavernous internal carotid arteries.

Skull: No acute fracture or focal lesion.

Sinuses/Orbits: Paranasal sinuses and mastoid air cells are clear.
The orbits are unremarkable.

Other: Left occipital scalp hematoma measuring up to 9 mm.
IMPRESSION: 1. No acute intracranial abnormality.
2. A 9mm left occipital scalp hematoma.

## 2022-08-20 IMAGING — CR DG ELBOW COMPLETE 3+V*L*
4 series · 4 of 4 positions shown · non-contrast
Comparison: None.

CLINICAL DATA: Fall with elbow laceration.

EXAM:
LEFT ELBOW - COMPLETE 3+ VIEW

[x elbow ap left]
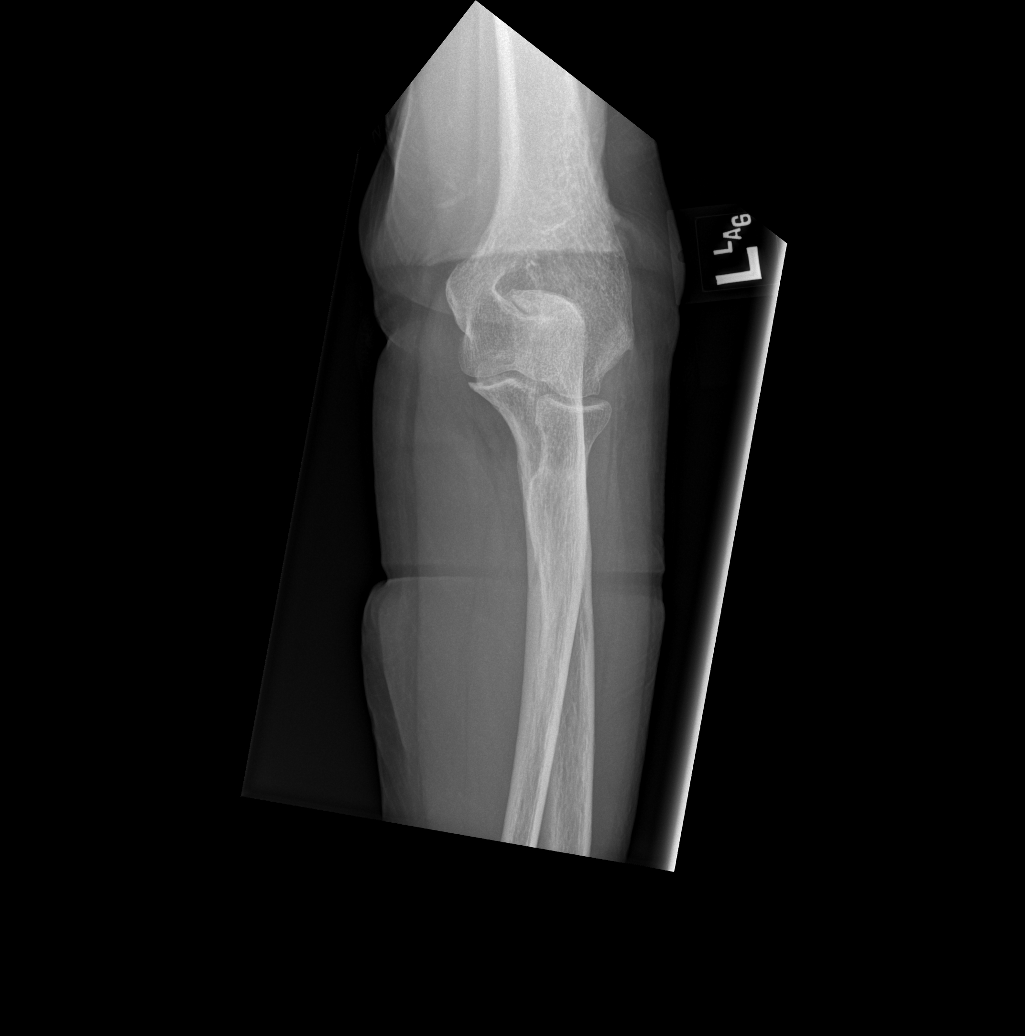

[x elbow obl left (1 of 2)]
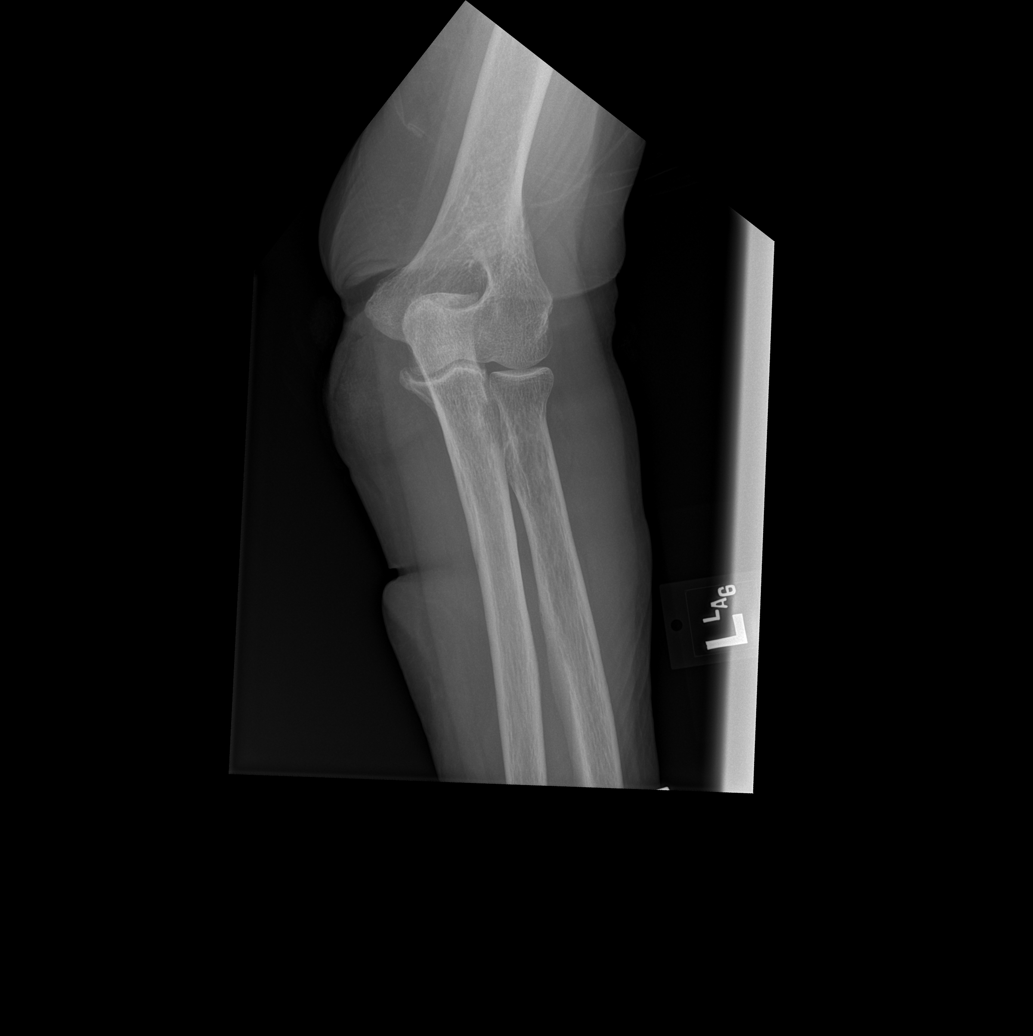

[x elbow obl left (2 of 2)]
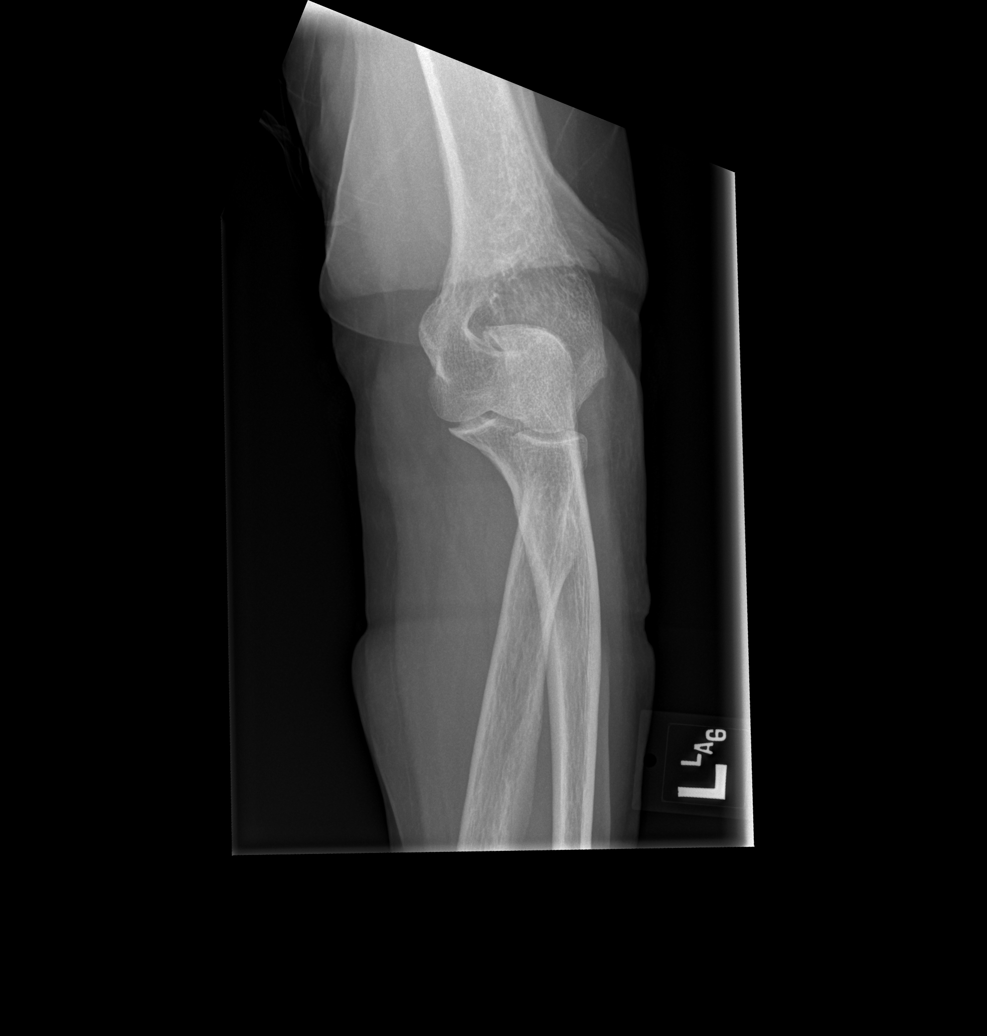

[x elbow lat left]
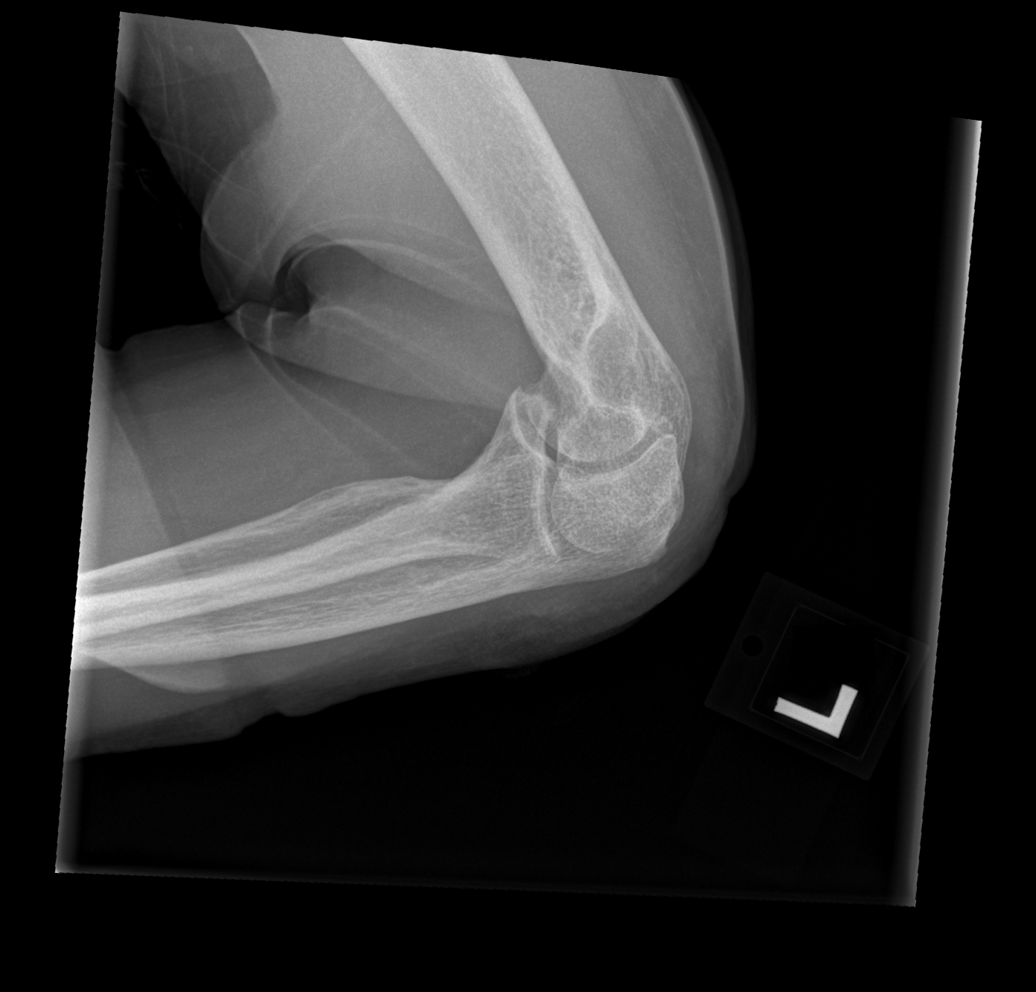

[4 of 4 positions shown; findings below may reference images not displayed]

FINDINGS: There is no evidence of fracture, dislocation, or joint effusion.
Lateral view is limited by positioning. Minimal degenerative
spurring. Posterior soft tissue edema. No radiopaque foreign body.
No tracking soft tissue air.
IMPRESSION: 1. Posterior soft tissue edema without acute fracture or
subluxation.
2. Mild degenerative spurring.
